# Patient Record
Sex: Female | Born: 1995 | Race: Black or African American | Hispanic: No | Marital: Single | Smoking: Never smoker
Health system: Southern US, Community
[De-identification: ages and names within clinical notes are randomized; demographics above are authoritative.]

## PROBLEM LIST (undated history)

## (undated) DIAGNOSIS — N6019 Diffuse cystic mastopathy of unspecified breast: Secondary | ICD-10-CM

## (undated) DIAGNOSIS — M6740 Ganglion, unspecified site: Secondary | ICD-10-CM

## (undated) DIAGNOSIS — R7303 Prediabetes: Secondary | ICD-10-CM

## (undated) DIAGNOSIS — M67471 Ganglion, right ankle and foot: Secondary | ICD-10-CM

## (undated) HISTORY — PX: WISDOM TOOTH EXTRACTION: SHX21

---
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
## 2017-03-22 ENCOUNTER — Ambulatory Visit (HOSPITAL_COMMUNITY)
Admission: RE | Admit: 2017-03-22 | Discharge: 2017-03-22 | Disposition: A | Discharge status: Home / Self Care | Payer: Self-pay | Source: Ambulatory Visit | Attending: Internal Medicine | Admitting: Internal Medicine

## 2017-03-22 ENCOUNTER — Ambulatory Visit: Payer: Medicaid Other | Attending: Internal Medicine | Admitting: Internal Medicine

## 2017-03-22 ENCOUNTER — Encounter: Payer: Self-pay | Admitting: Internal Medicine

## 2017-03-22 VITALS — BP 103/69 | HR 86 | Temp 98.3°F | Resp 16 | Wt 170.6 lb

## 2017-03-22 DIAGNOSIS — M79672 Pain in left foot: Secondary | ICD-10-CM | POA: Insufficient documentation

## 2017-03-22 DIAGNOSIS — Z9889 Other specified postprocedural states: Secondary | ICD-10-CM | POA: Insufficient documentation

## 2017-03-22 DIAGNOSIS — Z8249 Family history of ischemic heart disease and other diseases of the circulatory system: Secondary | ICD-10-CM | POA: Insufficient documentation

## 2017-03-22 DIAGNOSIS — B354 Tinea corporis: Secondary | ICD-10-CM

## 2017-03-22 DIAGNOSIS — Z79899 Other long term (current) drug therapy: Secondary | ICD-10-CM | POA: Insufficient documentation

## 2017-03-22 MED ORDER — CLOTRIMAZOLE 1 % EX CREA: 1.0000 "application " | TOPICAL_CREAM | Freq: Two times a day (BID) | CUTANEOUS | 0 refills | Status: DC

## 2017-03-22 NOTE — Progress Notes (Signed)
Patient ID: Nicole Griffin, female    DOB: [birth year]  MRN: [id number]  CC: New Patient (Initial Visit); Foot Pain; and Tinea (possible)   Subjective: Nicole Griffin is a 21 y.o. female who presents for lt foot pain Her concerns today include:  1.  Had ganglion cyst removed surgically from  LT foot 2 yrs ago -noticed swelling in same location last yr and it has gotten larger. -pain when she presses down on the area  2.  Thinks she has ringworm on LT buttock -notice rash x 3-4 wks. -does not itch but scaly.   There are no active problems to display for this patient.    Current Outpatient Prescriptions on File Prior to Visit  Medication Sig Dispense Refill  . HYDROcodone-acetaminophen (NORCO) 5-325 MG per tablet Take 2 tablets by mouth every 4 (four) hours as needed. (Patient not taking: Reported on 03/22/2017) 60 tablet 0  . ondansetron (ZOFRAN) 4 MG tablet Take 1 tablet (4 mg total) by mouth every 8 (eight) hours as needed for nausea or vomiting. (Patient not taking: Reported on 03/22/2017) 60 tablet 0   No current facility-administered medications on file prior to visit.     No Known Allergies  Social History   Social History  . Marital status: Single    Spouse name: N/A  . Number of children: N/A  . Years of education: N/A   Occupational History  . Not on file.   Social History Main Topics  . Smoking status: Never Smoker  . Smokeless tobacco: Never Used  . Alcohol use No  . Drug use: No  . Sexual activity: Not on file   Other Topics Concern  . Not on file   Social History Narrative  . No narrative on file    Family History  Problem Relation Age of Onset  . Hypertension Maternal Grandmother     Past Surgical History:  Procedure Laterality Date  . GANGLION CYST EXCISION Left 10/19/2014   Procedure: LEFT FOOT GANGLION EXCISION;  Surgeon: Renette Butters, MD;  Location: [facility];  Service: Orthopedics;  Laterality: Left;  . WISDOM  TOOTH EXTRACTION      ROS: Review of Systems As stated above PHYSICAL EXAM: BP 103/69   Pulse 86   Temp 98.3 F (36.8 C) (Oral)   Resp 16   Wt 170 lb 9.6 oz (77.4 kg)   SpO2 100%  LNMP ended 2nd week of this month. Physical Exam  General appearance - alert, well appearing, young African-American female and in no distress Mental status - alert, oriented to person, place, and time, normal mood, behavior, speech, dress, motor activity, and thought processes Musculoskeletal -LT foot: Healed scar noted on dorsum of midfoot. Slight tenderness around the scar but no distinct soft tissue mass appreciated. She does have edema of the soft tissue dorsum forefoot but not the toes. Mild tenderness over this edematous area. Good range of motion at the ankles and toes Skin -1.5 cm rash lateral left buttock. Upper long in shape with distinct scaly border and central clearing  ASSESSMENT AND PLAN: 1. Foot pain, left -Informed patient that I do not feel a distinct cysts at this time. I recommend observation. In regards to the soft tissue swelling I recommend elevation of the foot at nights and wearing compression socks during the day. - DG Foot Complete Left; Future  2. Ringworm of body - clotrimazole (LOTRIMIN AF) 1 % cream; Apply 1 application topically 2 (two) times daily.  Dispense: 30 g; Refill: 0  Patient was given the opportunity to ask questions.  Patient verbalized understanding of the plan and was able to repeat key elements of the plan.   Orders Placed This Encounter  Procedures  . DG Foot Complete Left     Requested Prescriptions   Signed Prescriptions Disp Refills  . clotrimazole (LOTRIMIN AF) 1 % cream 30 g 0    Sig: Apply 1 application topically 2 (two) times daily.   Karle Plumber, MD, FACP

## 2017-03-22 NOTE — Patient Instructions (Signed)
Use the Lotrimin cream on rash as discussed.   Try to elevated the left foot at nights and wear a compression socks during the day.

## 2017-03-23 ENCOUNTER — Telehealth: Payer: Self-pay

## 2017-03-23 NOTE — Telephone Encounter (Signed)
Contacted pt to go over xray results pt didn't answer lvm informing pt of results.    If pt calls back please give results: x-ray of her foot was normal.

## 2017-04-05 ENCOUNTER — Telehealth: Payer: Self-pay | Admitting: Internal Medicine

## 2017-04-05 DIAGNOSIS — R600 Localized edema: Secondary | ICD-10-CM

## 2017-04-05 NOTE — Telephone Encounter (Signed)
Will forward to pcp

## 2017-04-05 NOTE — Telephone Encounter (Signed)
Pt and her mother called requesting results of foot x-ray. Per notes in Epic left by [facility], results were given. Pt's mother requests a call from PCP [facility] or CMA Sallyanne Havers stating that the pt is still experiencing swelling and puffiness in her foot and would like to speak to someone about what may be causing it. Please f/u. Thank you.

## 2017-04-08 NOTE — Telephone Encounter (Signed)
Contacted mother to make aware bout the podiatry referral and it will take 1-2 weeks to hear for an appointment. Pt mother states she understands and doesn't have any questions or concerns

## 2017-04-15 ENCOUNTER — Encounter: Payer: Self-pay | Admitting: Internal Medicine

## 2017-04-15 ENCOUNTER — Ambulatory Visit: Payer: Self-pay | Attending: Internal Medicine | Admitting: Internal Medicine

## 2017-04-15 VITALS — BP 91/64 | HR 81 | Temp 98.4°F | Resp 16 | Wt 168.6 lb

## 2017-04-15 DIAGNOSIS — F419 Anxiety disorder, unspecified: Secondary | ICD-10-CM

## 2017-04-15 DIAGNOSIS — F329 Major depressive disorder, single episode, unspecified: Secondary | ICD-10-CM

## 2017-04-15 DIAGNOSIS — G43019 Migraine without aura, intractable, without status migrainosus: Secondary | ICD-10-CM

## 2017-04-15 MED ORDER — IBUPROFEN 600 MG PO TABS: 600.0000 mg | ORAL_TABLET | Freq: Three times a day (TID) | ORAL | 1 refills | Status: DC | PRN

## 2017-04-15 MED ORDER — AMITRIPTYLINE HCL 10 MG PO TABS: 10.0000 mg | ORAL_TABLET | Freq: Every day | ORAL | 1 refills | Status: DC

## 2017-04-15 MED FILL — AMITRIPTYLINE HCL 10 MG TAB: 10 | 30 days supply | Qty: 30 | Fill #0

## 2017-04-15 MED FILL — IBUPROFEN 600 MG TABLET: 600 | 20 days supply | Qty: 60 | Fill #0

## 2017-04-15 NOTE — Patient Instructions (Signed)
Please give appt with Ms. Luciana Axe.   Migraine Headache A migraine headache is a very strong throbbing pain on one side or both sides of your head. Migraines can also cause other symptoms. Talk with your doctor about what things may bring on (trigger) your migraine headaches. Follow these instructions at home: Medicines  Take over-the-counter and prescription medicines only as told by your doctor.  Do not drive or use heavy machinery while taking prescription pain medicine.  To prevent or treat constipation while you are taking prescription pain medicine, your doctor may recommend that you: ? Drink enough fluid to keep your pee (urine) clear or pale yellow. ? Take over-the-counter or prescription medicines. ? Eat foods that are high in fiber. These include fresh fruits and vegetables, whole grains, and beans. ? Limit foods that are high in fat and processed sugars. These include fried and sweet foods. Lifestyle  Avoid alcohol.  Do not use any products that contain nicotine or tobacco, such as cigarettes and e-cigarettes. If you need help quitting, ask your doctor.  Get at least 8 hours of sleep every night.  Limit your stress. General instructions   Keep a journal to find out what may bring on your migraines. For example, write down: ? What you eat and drink. ? How much sleep you get. ? Any change in what you eat or drink. ? Any change in your medicines.  If you have a migraine: ? Avoid things that make your symptoms worse, such as bright lights. ? It may help to lie down in a dark, quiet room. ? Do not drive or use heavy machinery. ? Ask your doctor what activities are safe for you.  Keep all follow-up visits as told by your doctor. This is important. Contact a doctor if:  You get a migraine that is different or worse than your usual migraines. Get help right away if:  Your migraine gets very bad.  You have a fever.  You have a stiff neck.  You have trouble  seeing.  Your muscles feel weak or like you cannot control them.  You start to lose your balance a lot.  You start to have trouble walking.  You pass out (faint). This information is not intended to replace advice given to you by your health care provider. Make sure you discuss any questions you have with your health care provider. Document Released: 06/23/2008 Document Revised: 04/03/2016 Document Reviewed: 03/02/2016 Elsevier Interactive Patient Education  2017 [employer].

## 2017-04-15 NOTE — Progress Notes (Signed)
Patient ID: Nicole Griffin, female    DOB: October 02, 1995  MRN: [id number]  CC: Headache   Subjective: Nicole Griffin is a 21 y.o. female who presents for UC visit. Her concerns today include:   Pt c/o headaches for past 2 wks -RT side of head and face. -daily for past 1.5 wks -comes on suddenly and last 6-7 hrs "if I don't take anything." -"I can feel it coming on.  Can start at back of my head and goes to the top." -no N/V, blurred vision, dizziness. Photophobia and phenophobia sometimes.  -better laying down than moving around -no prior hx of headaches.  -taking Aleve, not every day.  Takes 1 tab with relief in 1 hr. -triggered by stress - stressors include school and every day life acknowledges feeling depressed.  "overthinking things.  Some days I don't want to get out of bed."  Depressed for 1.5 yrs.  No SI/HI.  Also feels anxiety over life since high school.  Getting worse.  "If I have really bad anxiety, I don't talk and just stay in my room."  Worries about having another attack when she has one. -not on meds in past for depression/anxiety    Current Outpatient Prescriptions on File Prior to Visit  Medication Sig Dispense Refill  . clotrimazole (LOTRIMIN AF) 1 % cream Apply 1 application topically 2 (two) times daily. (Patient not taking: Reported on 04/15/2017) 30 g 0  . ondansetron (ZOFRAN) 4 MG tablet Take 1 tablet (4 mg total) by mouth every 8 (eight) hours as needed for nausea or vomiting. (Patient not taking: Reported on 03/22/2017) 60 tablet 0   No current facility-administered medications on file prior to visit.     No Known Allergies  Social History   Social History  . Marital status: Single    Spouse name: N/A  . Number of children: N/A  . Years of education: N/A   Occupational History  . Not on file.   Social History Main Topics  . Smoking status: Never Smoker  . Smokeless tobacco: Never Used  . Alcohol use No  . Drug use: No  . Sexual activity:  Not on file   Other Topics Concern  . Not on file   Social History Narrative  . No narrative on file    Family History  Problem Relation Age of Onset  . Hypertension Maternal Grandmother     Past Surgical History:  Procedure Laterality Date  . GANGLION CYST EXCISION Left 10/19/2014   Procedure: LEFT FOOT GANGLION EXCISION;  Surgeon: Renette Butters, MD;  Location: [facility];  Service: Orthopedics;  Laterality: Left;  . WISDOM TOOTH EXTRACTION      ROS: Review of Systems As stated above PHYSICAL EXAM: BP 91/64   Pulse 81   Temp 98.4 F (36.9 C) (Oral)   Resp 16   Wt 168 lb 9.6 oz (76.5 kg)   SpO2 100%   LNMP: 04/09/2017.  BP 110/80 Physical Exam  General appearance - alert, well appearing, and in no distress Mental status - alert, oriented to person, place, and time, normal mood, behavior, speech, dress, motor activity, and thought processes Neck:Supple Ears: Small amount of soft wax in both ears otherwise canal and TM within normal limits Nose: no enlargement of nasal turbinates Neurological - cranial nerves II through XII intact, normal muscle tone, no tremors, strength 5/5. Gross sensation intact  Depression screen PHQ 2/9 04/15/2017  Decreased Interest 1  Down, Depressed, Hopeless 2  PHQ - 2 Score 3  Altered sleeping 2  Tired, decreased energy 1  Change in appetite 0  Feeling bad or failure about yourself  2  Trouble concentrating 0  Moving slowly or fidgety/restless 0  Suicidal thoughts 1  PHQ-9 Score 9   GAD 7 : Generalized Anxiety Score 04/15/2017 03/22/2017  Nervous, Anxious, on Edge 3 3  Control/stop worrying 3 2  Worry too much - different things 3 2  Trouble relaxing 0 0  Restless 0 0  Easily annoyed or irritable 3 3  Afraid - awful might happen 0 3  Total GAD 7 Score 12 13     ASSESSMENT AND PLAN: 1. Intractable migraine without aura and without status migrainosus -Headaches suggest migraines. We discussed ways to manage  stress since this is one of her triggers. She declines meeting with LCSW today. -Discussed migraine management with abortive and prophylactic therapies. Since she responds well to Aleve will use 600 mg of ibuprofen for abortive therapy. For prophylactic we decided to go with amitriptyline as it will also help with depression. Patient informed that med can cause drowsiness - ibuprofen (ADVIL,MOTRIN) 600 MG tablet; Take 1 tablet (600 mg total) by mouth every 8 (eight) hours as needed.  Dispense: 60 tablet; Refill: 1 - amitriptyline (ELAVIL) 10 MG tablet; Take 1 tablet (10 mg total) by mouth at bedtime.  Dispense: 30 tablet; Refill: 1  2. Anxiety and depression -We discussed treatment options like an SSRI but patient declines. She was okay with amitriptyline at bedtime to help decrease frequency of headaches when told that it will also help with depression. She declines meeting with our LCSW today. I discuss information about community resources including [place]. - amitriptyline (ELAVIL) 10 MG tablet; Take 1 tablet (10 mg total) by mouth at bedtime.  Dispense: 30 tablet; Refill: 1  She has form for [facility] discount and had some questions about it.  I deferred to Ms. Luciana Axe.  Patient was given the opportunity to ask questions.  Patient verbalized understanding of the plan and was able to repeat key elements of the plan.   No orders of the defined types were placed in this encounter.    Requested Prescriptions   Signed Prescriptions Disp Refills  . ibuprofen (ADVIL,MOTRIN) 600 MG tablet 60 tablet 1    Sig: Take 1 tablet (600 mg total) by mouth every 8 (eight) hours as needed.  Marland Kitchen amitriptyline (ELAVIL) 10 MG tablet 30 tablet 1    Sig: Take 1 tablet (10 mg total) by mouth at bedtime.    Return if symptoms worsen or fail to improve.  Karle Plumber, MD, FACP

## 2017-04-19 ENCOUNTER — Ambulatory Visit: Payer: Self-pay | Attending: Internal Medicine

## 2018-04-04 ENCOUNTER — Other Ambulatory Visit (HOSPITAL_COMMUNITY): Payer: Self-pay | Admitting: *Deleted

## 2018-04-04 DIAGNOSIS — N6310 Unspecified lump in the right breast, unspecified quadrant: Secondary | ICD-10-CM

## 2018-04-05 ENCOUNTER — Ambulatory Visit: Payer: Self-pay | Attending: Internal Medicine | Admitting: Internal Medicine

## 2018-04-05 ENCOUNTER — Encounter: Payer: Self-pay | Admitting: Internal Medicine

## 2018-04-05 VITALS — BP 119/76 | HR 68 | Temp 98.9°F | Resp 16 | Wt 167.0 lb

## 2018-04-05 DIAGNOSIS — Z79899 Other long term (current) drug therapy: Secondary | ICD-10-CM | POA: Insufficient documentation

## 2018-04-05 DIAGNOSIS — N6310 Unspecified lump in the right breast, unspecified quadrant: Secondary | ICD-10-CM | POA: Insufficient documentation

## 2018-04-05 NOTE — Patient Instructions (Addendum)
Please let me know if you have not had the ultra sound done in 1 month.   Please call the [facility] (breast and cervical cancer control program) at [phone] to schedule diagnostic mammogram   [facility]

## 2018-04-05 NOTE — Progress Notes (Signed)
Pt states she has a lump in her right breast

## 2018-04-05 NOTE — Progress Notes (Signed)
Patient ID: ELLEEN Griffin, female    DOB: 1995-11-21  MRN: [id number]  CC: Mass (possible )   Subjective: Nicole Griffin is a 22 y.o. female who presents for.  Mom is with her. Her concerns today include:   Patient complains of lump in RT x 2 wks Not painful. previous lump in the same breast evaluated in 2010 and was found to be a 7 mm cyst in the 9 o'clock position.  Patient states that the current lump is in a position similar to the one from 9 years ago.  No family history of breast cancer.    Current Outpatient Medications on File Prior to Visit  Medication Sig Dispense Refill  . amitriptyline (ELAVIL) 10 MG tablet Take 1 tablet (10 mg total) by mouth at bedtime. 30 tablet 1  . clotrimazole (LOTRIMIN AF) 1 % cream Apply 1 application topically 2 (two) times daily. (Patient not taking: Reported on 04/15/2017) 30 g 0  . ibuprofen (ADVIL,MOTRIN) 600 MG tablet Take 1 tablet (600 mg total) by mouth every 8 (eight) hours as needed. 60 tablet 1   No current facility-administered medications on file prior to visit.     No Known Allergies  Social History   Socioeconomic History  . Marital status: Single    Spouse name: Not on file  . Number of children: Not on file  . Years of education: Not on file  . Highest education level: Not on file  Occupational History  . Not on file  Social Needs  . Financial resource strain: Not on file  . Food insecurity:    Worry: Not on file    Inability: Not on file  . Transportation needs:    Medical: Not on file    Non-medical: Not on file  Tobacco Use  . Smoking status: Never Smoker  . Smokeless tobacco: Never Used  Substance and Sexual Activity  . Alcohol use: No  . Drug use: No  . Sexual activity: Not on file  Lifestyle  . Physical activity:    Days per week: Not on file    Minutes per session: Not on file  . Stress: Not on file  Relationships  . Social connections:    Talks on phone: Not on file    Gets together: Not on  file    Attends religious service: Not on file    Active member of club or organization: Not on file    Attends meetings of clubs or organizations: Not on file    Relationship status: Not on file  . Intimate partner violence:    Fear of current or ex partner: Not on file    Emotionally abused: Not on file    Physically abused: Not on file    Forced sexual activity: Not on file  Other Topics Concern  . Not on file  Social History Narrative  . Not on file    Family History  Problem Relation Age of Onset  . Hypertension Maternal Grandmother     Past Surgical History:  Procedure Laterality Date  . GANGLION CYST EXCISION Left 10/19/2014   Procedure: LEFT FOOT GANGLION EXCISION;  Surgeon: Renette Butters, MD;  Location: [facility];  Service: Orthopedics;  Laterality: Left;  . WISDOM TOOTH EXTRACTION      ROS: Review of Systems Negative except as stated above PHYSICAL EXAM: BP 119/76   Pulse 68   Temp 98.9 F (37.2 C) (Oral)   Resp 16   Wt  167 lb (75.8 kg)   SpO2 100%   Physical Exam  General appearance - alert, well appearing, and in no distress Mental status - normal mood, behavior, speech, dress, motor activity, and thought processes Breasts -no axillary lymphadenopathy.  Examination of both breasts reveal no asymmetry or dimpling of the skin.  No palpable mass in the left breast.  3 cm firm movable mass in the right breast around about the 9 o'clock position.   ASSESSMENT AND PLAN: 1. Mass of breast, right She will need to have an ultrasound and further management will be based on results.  Patient has family-planning Medicaid.  I am not sure whether this would cover.  In any event I had my CMA tell her how to go about calling to schedule the appointment with [facility]P just encase. - US BREAST COMPLETE UNI RIGHT INC AXILLA; Future  Patient was given the opportunity to ask questions.  Patient verbalized understanding of the plan and was able to repeat key  elements of the plan.   Orders Placed This Encounter  Procedures  . US BREAST COMPLETE UNI RIGHT INC AXILLA     Requested Prescriptions    No prescriptions requested or ordered in this encounter    No follow-ups on file.  Karle Plumber, MD, FACP

## 2018-04-26 ENCOUNTER — Ambulatory Visit (HOSPITAL_COMMUNITY)
Admission: RE | Admit: 2018-04-26 | Discharge: 2018-04-26 | Disposition: A | Discharge status: Home / Self Care | Payer: Self-pay | Source: Ambulatory Visit | Attending: Obstetrics and Gynecology | Admitting: Obstetrics and Gynecology

## 2018-04-26 ENCOUNTER — Ambulatory Visit
Admission: RE | Admit: 2018-04-26 | Discharge: 2018-04-26 | Disposition: A | Discharge status: Home / Self Care | Payer: No Typology Code available for payment source | Source: Ambulatory Visit | Attending: Obstetrics and Gynecology | Admitting: Obstetrics and Gynecology

## 2018-04-26 ENCOUNTER — Encounter (HOSPITAL_COMMUNITY): Payer: Self-pay

## 2018-04-26 ENCOUNTER — Other Ambulatory Visit (HOSPITAL_COMMUNITY): Payer: Self-pay | Admitting: Obstetrics and Gynecology

## 2018-04-26 VITALS — BP 105/67 | Ht 62.0 in

## 2018-04-26 DIAGNOSIS — Z1239 Encounter for other screening for malignant neoplasm of breast: Secondary | ICD-10-CM

## 2018-04-26 DIAGNOSIS — N6310 Unspecified lump in the right breast, unspecified quadrant: Secondary | ICD-10-CM

## 2018-04-26 DIAGNOSIS — D241 Benign neoplasm of right breast: Secondary | ICD-10-CM

## 2018-04-26 DIAGNOSIS — N6315 Unspecified lump in the right breast, overlapping quadrants: Secondary | ICD-10-CM

## 2018-04-26 NOTE — Patient Instructions (Addendum)
Explained breast self awareness with Youlanda Mighty. Let patient know BCCCP will cover Pap smears every 3 years unless has a history of abnormal Pap smears. Referred patient to the [facility] for a right breast ultrasound. Appointment scheduled for Tuesday, April 26, 2018 at 1300. Youlanda Mighty verbalized understanding.  Hadyn Azer, Arvil Chaco, RN 11:16 AM

## 2018-04-26 NOTE — Progress Notes (Signed)
Complaints of right breast lump x 7 years that has increased in size over the past month.   Pap Smear: Pap smear not completed today. Per patient has never had a Pap smear completed. Patient stated she has an appointment with Charlesetta Ivory to have her Pap smear completed in October. Patient will have a copy of Pap smear result forwarded to [facility]. No Pap smear results are in Epic.  Physical exam: Breasts Breasts symmetrical. No skin abnormalities bilateral breasts. No nipple retraction bilateral breasts. No nipple discharge bilateral breasts. No lymphadenopathy. No lumps palpated left breast. Palpated a mobile lump within the right breast at 9 o'clock 2 cm from the nipple. No complaints of pain or tenderness on exam. Referred patient to the [facility] for a right breast ultrasound. Appointment scheduled for Tuesday, April 26, 2018 at 1300.        Pelvic/Bimanual No Pap smear completed today since patient has appointment in October to have her Pap smear completed with her OBGYN.   Smoking History: Patient has never smoked.  Patient Navigation: Patient education provided. Access to services provided for patient through B[facility]CP program.  Breast and Cervical Cancer Risk Assessment: Patient has no family history of breast cancer, known genetic mutations, or radiation treatment to the chest before age 54. Patient has no history of cervical dysplasia, immunocompromised, or DES exposure in-utero. Breast Cancer Risk assessement completed. No risk calculated due to patient is less than 42 years old.

## 2018-04-29 ENCOUNTER — Encounter (HOSPITAL_COMMUNITY): Payer: Self-pay | Admitting: *Deleted

## 2018-10-28 ENCOUNTER — Other Ambulatory Visit (HOSPITAL_COMMUNITY): Payer: Self-pay | Admitting: Obstetrics and Gynecology

## 2018-10-28 ENCOUNTER — Ambulatory Visit
Admission: RE | Admit: 2018-10-28 | Discharge: 2018-10-28 | Disposition: A | Discharge status: Home / Self Care | Payer: No Typology Code available for payment source | Source: Ambulatory Visit | Attending: Obstetrics and Gynecology | Admitting: Obstetrics and Gynecology

## 2018-10-28 DIAGNOSIS — D241 Benign neoplasm of right breast: Secondary | ICD-10-CM

## 2018-11-14 ENCOUNTER — Encounter: Payer: Self-pay | Admitting: Internal Medicine

## 2018-11-14 NOTE — Progress Notes (Signed)
I received a note from Dr. Marlou Starks of [facility] surgery.  He saw the patient 11/08/2018.  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  Patient wishes to have it removed.  He plans to do a lumpectomy.

## 2018-11-15 ENCOUNTER — Ambulatory Visit: Payer: Self-pay | Admitting: General Surgery

## 2018-12-13 ENCOUNTER — Encounter: Payer: Self-pay | Admitting: *Deleted

## 2018-12-23 ENCOUNTER — Ambulatory Visit (HOSPITAL_BASED_OUTPATIENT_CLINIC_OR_DEPARTMENT_OTHER): Admit: 2018-12-23 | Payer: No Typology Code available for payment source | Admitting: General Surgery

## 2018-12-23 ENCOUNTER — Encounter (HOSPITAL_BASED_OUTPATIENT_CLINIC_OR_DEPARTMENT_OTHER): Payer: Self-pay

## 2018-12-23 SURGERY — BREAST LUMPECTOMY
Anesthesia: General | Site: Breast | Laterality: Right

## 2019-03-17 ENCOUNTER — Encounter (HOSPITAL_BASED_OUTPATIENT_CLINIC_OR_DEPARTMENT_OTHER): Payer: Self-pay | Admitting: *Deleted

## 2019-03-17 ENCOUNTER — Other Ambulatory Visit: Payer: Self-pay

## 2019-03-17 NOTE — Progress Notes (Signed)
Pre op call done for right breast lumpectomy scheduled for 03/27/19 with Dr Marlou Starks. Towards the end of the call the pt mentioned that she would like to postpone the surgery. I gave her the main number to [facility] and asked her to call and speak with the surgery scheduler and find a better date. She verbalized understanding.

## 2019-03-23 ENCOUNTER — Other Ambulatory Visit (HOSPITAL_COMMUNITY): Payer: No Typology Code available for payment source

## 2019-03-23 NOTE — Progress Notes (Signed)
I spoke to Nicole Griffin mother to inquire if she would be arriving soon for her Covid 76 screen. Her mother stated her daughter is planning to reschedule her procedure on 6/29 for a later date. I canceled her screening appointment for today. She assured me she would call Dr Marlou Starks office today to reschedule procedure.

## 2019-03-24 NOTE — Progress Notes (Signed)
Called Dr. Ethlyn Gallery office today to inquire if Nicole Griffin had decided to cancel or proceed with procedure 6/29. Would need to reschedule Covid 19 screen for today. The office will call patient and follow up with patient decision.

## 2019-03-27 ENCOUNTER — Ambulatory Visit (HOSPITAL_BASED_OUTPATIENT_CLINIC_OR_DEPARTMENT_OTHER)
Admission: RE | Admit: 2019-03-27 | Payer: No Typology Code available for payment source | Source: Home / Self Care | Admitting: General Surgery

## 2019-03-27 SURGERY — BREAST LUMPECTOMY
Anesthesia: General | Site: Breast | Laterality: Right

## 2019-04-29 ENCOUNTER — Encounter (HOSPITAL_COMMUNITY): Payer: Self-pay | Admitting: *Deleted

## 2019-04-29 ENCOUNTER — Other Ambulatory Visit: Payer: Self-pay

## 2019-04-29 ENCOUNTER — Ambulatory Visit (HOSPITAL_COMMUNITY)
Admission: EM | Admit: 2019-04-29 | Discharge: 2019-04-29 | Disposition: A | Discharge status: Home / Self Care | Payer: BC Managed Care – PPO | Attending: Emergency Medicine | Admitting: Emergency Medicine

## 2019-04-29 ENCOUNTER — Ambulatory Visit (INDEPENDENT_AMBULATORY_CARE_PROVIDER_SITE_OTHER): Payer: BC Managed Care – PPO

## 2019-04-29 DIAGNOSIS — S9001XA Contusion of right ankle, initial encounter: Secondary | ICD-10-CM | POA: Diagnosis not present

## 2019-04-29 DIAGNOSIS — W109XXA Fall (on) (from) unspecified stairs and steps, initial encounter: Secondary | ICD-10-CM | POA: Diagnosis not present

## 2019-04-29 DIAGNOSIS — Y92009 Unspecified place in unspecified non-institutional (private) residence as the place of occurrence of the external cause: Secondary | ICD-10-CM | POA: Diagnosis not present

## 2019-04-29 HISTORY — DX: Diffuse cystic mastopathy of unspecified breast: N60.19

## 2019-04-29 HISTORY — DX: Ganglion, unspecified site: M67.40

## 2019-04-29 MED ORDER — IBUPROFEN 600 MG PO TABS: 600.0000 mg | ORAL_TABLET | Freq: Four times a day (QID) | ORAL | 0 refills | Status: DC | PRN

## 2019-04-29 NOTE — ED Provider Notes (Signed)
[place]    CSN: [id number] Arrival date & time: 04/29/19  1506     History   Chief Complaint Chief Complaint  Patient presents with  . Ankle Pain    HPI Nicole Griffin is a 23 y.o. female.   Patient presents with right ankle and foot pain after tripped and fell down her steps approximately 40 minutes PTA.  She denies head injury or LOC.  States she twisted her ankle and now has pain and swelling.  She denies LE numbness, paresthesias, weakness.  LMP: 1 week.    The history is provided by the patient.    Past Medical History:  Diagnosis Date  . Fibrocystic breast   . Ganglion cyst     There are no active problems to display for this patient.   Past Surgical History:  Procedure Laterality Date  . GANGLION CYST EXCISION Left 10/19/2014   Procedure: LEFT FOOT GANGLION EXCISION;  Surgeon: Renette Butters, MD;  Location: [place];  Service: Orthopedics;  Laterality: Left;  . WISDOM TOOTH EXTRACTION      OB History    Gravida  0   Para  0   Term  0   Preterm  0   AB  0   Living  0     SAB  0   TAB  0   Ectopic  0   Multiple  0   Live Births  0            Home Medications    Prior to Admission medications   Medication Sig Start Date End Date Taking? Authorizing Provider  drospirenone-ethinyl estradiol (NIKKI) 3-0.02 MG tablet Take 1 tablet by mouth daily.   Yes Joline Salt, RN  ibuprofen (ADVIL) 600 MG tablet Take 1 tablet (600 mg total) by mouth every 6 (six) hours as needed. 04/29/19   Sharion Balloon, NP  Multiple Vitamins-Minerals (EQ MULTIVITAMINS ADULT GUMMY) CHEW Chew by mouth.    Joline Salt, RN    Family History Family History  Problem Relation Age of Onset  . Hypertension Maternal Grandmother   . Healthy Mother     Social History Social History   Tobacco Use  . Smoking status: Never Smoker  . Smokeless tobacco: Never Used  Substance Use Topics  . Alcohol use: Yes    Comment: rare  .  Drug use: No     Allergies   Patient has no known allergies.   Review of Systems Review of Systems  Constitutional: Negative for chills and fever.  HENT: Negative for ear pain and sore throat.   Eyes: Negative for pain and visual disturbance.  Respiratory: Negative for cough and shortness of breath.   Cardiovascular: Negative for chest pain and palpitations.  Gastrointestinal: Negative for abdominal pain and vomiting.  Genitourinary: Negative for dysuria and hematuria.  Musculoskeletal: Positive for arthralgias. Negative for back pain.  Skin: Negative for color change and rash.  Neurological: Negative for seizures and syncope.  All other systems reviewed and are negative.    Physical Exam Triage Vital Signs ED Triage Vitals  Enc Vitals Group     BP      Pulse      Resp      Temp      Temp src      SpO2      Weight      Height      Head Circumference      Peak  Flow      Pain Score      Pain Loc      Pain Edu?      Excl. in [place]?    No data found.  Updated Vital Signs BP 110/67   Pulse 82   Temp 98.6 F (37 C) (Oral)   Resp 18   Wt 153 lb (69.4 kg)   LMP 04/23/2019 (Exact Date)   SpO2 100%   BMI 27.98 kg/m   Visual Acuity Right Eye Distance:   Left Eye Distance:   Bilateral Distance:    Right Eye Near:   Left Eye Near:    Bilateral Near:     Physical Exam Vitals signs and nursing note reviewed.  Constitutional:      General: She is not in acute distress.    Appearance: She is well-developed.  HENT:     Head: Normocephalic and atraumatic.  Eyes:     Conjunctiva/sclera: Conjunctivae normal.  Neck:     Musculoskeletal: Neck supple.  Cardiovascular:     Rate and Rhythm: Normal rate and regular rhythm.     Heart sounds: No murmur.  Pulmonary:     Effort: Pulmonary effort is normal. No respiratory distress.     Breath sounds: Normal breath sounds.  Abdominal:     Palpations: Abdomen is soft.     Tenderness: There is no abdominal tenderness.   Musculoskeletal:        General: Swelling and tenderness present. No deformity.       Feet:  Feet:     Comments: Right foot: Tenderness and mild edema in area indicated on diagram. No ecchymosis or erythema.  2+ pulses, sensation intact.  Range of motion limited by pain. Skin:    General: Skin is warm and dry.  Neurological:     Mental Status: She is alert.     Sensory: No sensory deficit.     Motor: No weakness.      UC Treatments / Results  Labs (all labs ordered are listed, but only abnormal results are displayed) Labs Reviewed - No data to display  EKG   Radiology Dg Ankle Complete Right  Result Date: 04/29/2019 CLINICAL DATA:  Status post fall with right ankle pain. EXAM: RIGHT ANKLE - COMPLETE 3+ VIEW COMPARISON:  None. FINDINGS: There is no evidence of fracture, dislocation, or joint effusion. There is no evidence of arthropathy or other focal bone abnormality. Soft tissues are unremarkable. IMPRESSION: Negative. Electronically Signed   By: Abelardo Diesel M.D.   On: 04/29/2019 16:13    Procedures Procedures (including critical care time)  Medications Ordered in UC Medications - No data to display  Initial Impression / Assessment and Plan / UC Course  I have reviewed the triage vital signs and the nursing notes.  Pertinent labs & imaging results that were available during my care of the patient were reviewed by me and considered in my medical decision making (see chart for details).   Contusion of right ankle.  X-ray negative.  Treating with rest, elevation, ice packs, ibuprofen, Ace wrap.  Instructed patient to return here or go to the emergency department if she develops worsening pain, numbness, paresthesias, weakness, or other concerning symptoms.  Discussed that if her pain is not improving, she can consider following up with orthopedics.     Final Clinical Impressions(s) / UC Diagnoses   Final diagnoses:  Contusion of right ankle, initial encounter      Discharge Instructions     Your  x-ray was negative today.    Rest and elevate your ankle.  Apply ice packs as directed.  Wear the Ace wrap for your comfort.  Take the prescribed ibuprofen as needed for discomfort.    Return here or go to the emergency department if you develop worsening pain, numbness, tingling, weakness, or other concerns.        ED Prescriptions    Medication Sig Dispense Auth. Provider   ibuprofen (ADVIL) 600 MG tablet Take 1 tablet (600 mg total) by mouth every 6 (six) hours as needed. 30 tablet Sharion Balloon, NP     Controlled Substance Prescriptions [place] Controlled Substance Registry consulted? Not Applicable   Sharion Balloon, NP 04/29/19 1850

## 2019-04-29 NOTE — ED Triage Notes (Signed)
Reports falling down stairs approx 40 min ago while wearing "trendy shoes".  C/O right ankle pain with painful weight bearing.  Denies any foot pain with palpation.

## 2019-04-29 NOTE — Discharge Instructions (Addendum)
Your x-ray was negative today.    Rest and elevate your ankle.  Apply ice packs as directed.  Wear the Ace wrap for your comfort.  Take the prescribed ibuprofen as needed for discomfort.    Return here or go to the emergency department if you develop worsening pain, numbness, tingling, weakness, or other concerns.

## 2019-05-01 ENCOUNTER — Other Ambulatory Visit: Payer: No Typology Code available for payment source

## 2019-09-23 ENCOUNTER — Ambulatory Visit (HOSPITAL_COMMUNITY)
Admission: EM | Admit: 2019-09-23 | Discharge: 2019-09-23 | Discharge status: Against Medical Advice | Payer: Medicaid Other

## 2019-09-23 NOTE — ED Notes (Signed)
Per D. Watts with patient intake; pt left, and did not say anything.

## 2019-10-06 ENCOUNTER — Telehealth (HOSPITAL_COMMUNITY): Payer: Self-pay

## 2019-10-06 NOTE — Telephone Encounter (Signed)
Telephoned patient at home number. Left a message to call and schedule with [facility]. 

## 2019-10-09 ENCOUNTER — Telehealth (HOSPITAL_COMMUNITY): Payer: Self-pay

## 2019-10-09 NOTE — Telephone Encounter (Signed)
Telephoned patient at home number. Left a voice message for patient to call and schedule with [facility]. °

## 2020-03-29 IMAGING — DX RIGHT ANKLE - COMPLETE 3+ VIEW
3 series · 3 of 3 positions shown · non-contrast
Comparison: None.

CLINICAL DATA: Status post fall with right ankle pain.

EXAM:
RIGHT ANKLE - COMPLETE 3+ VIEW

[ankle ap]
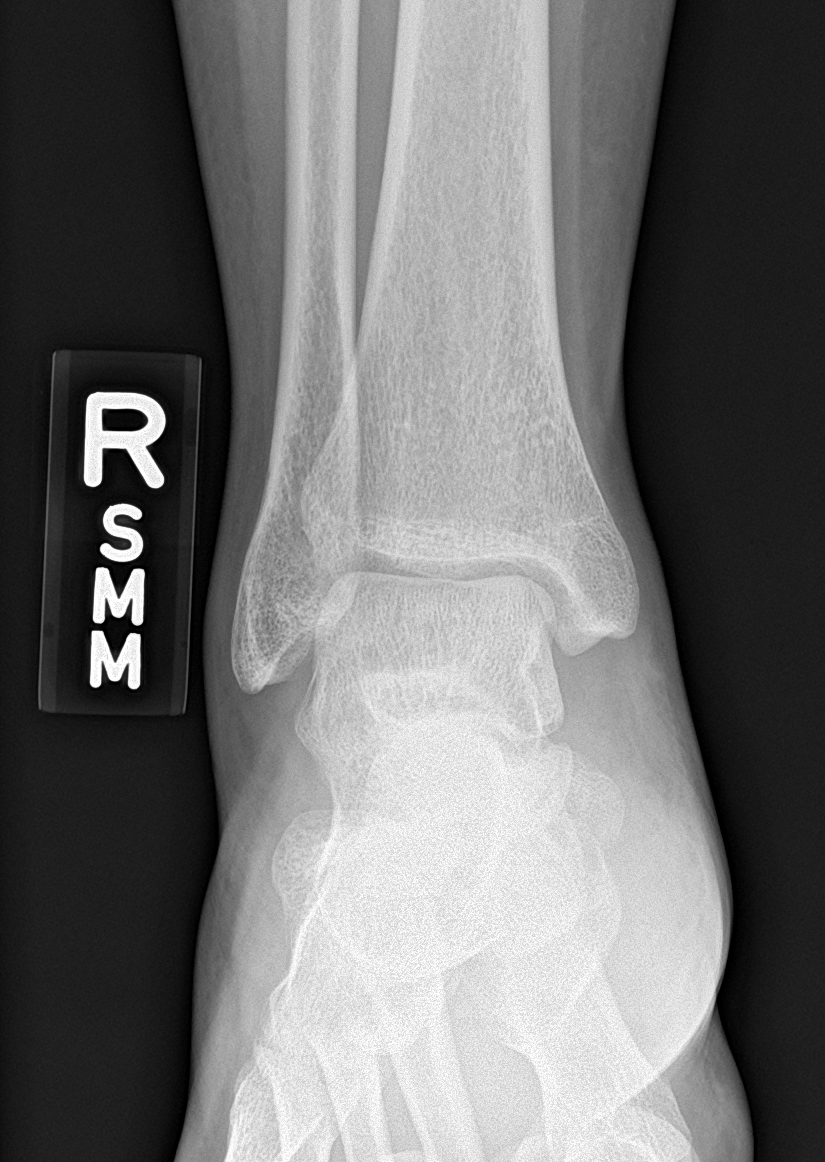

[ankle obl]
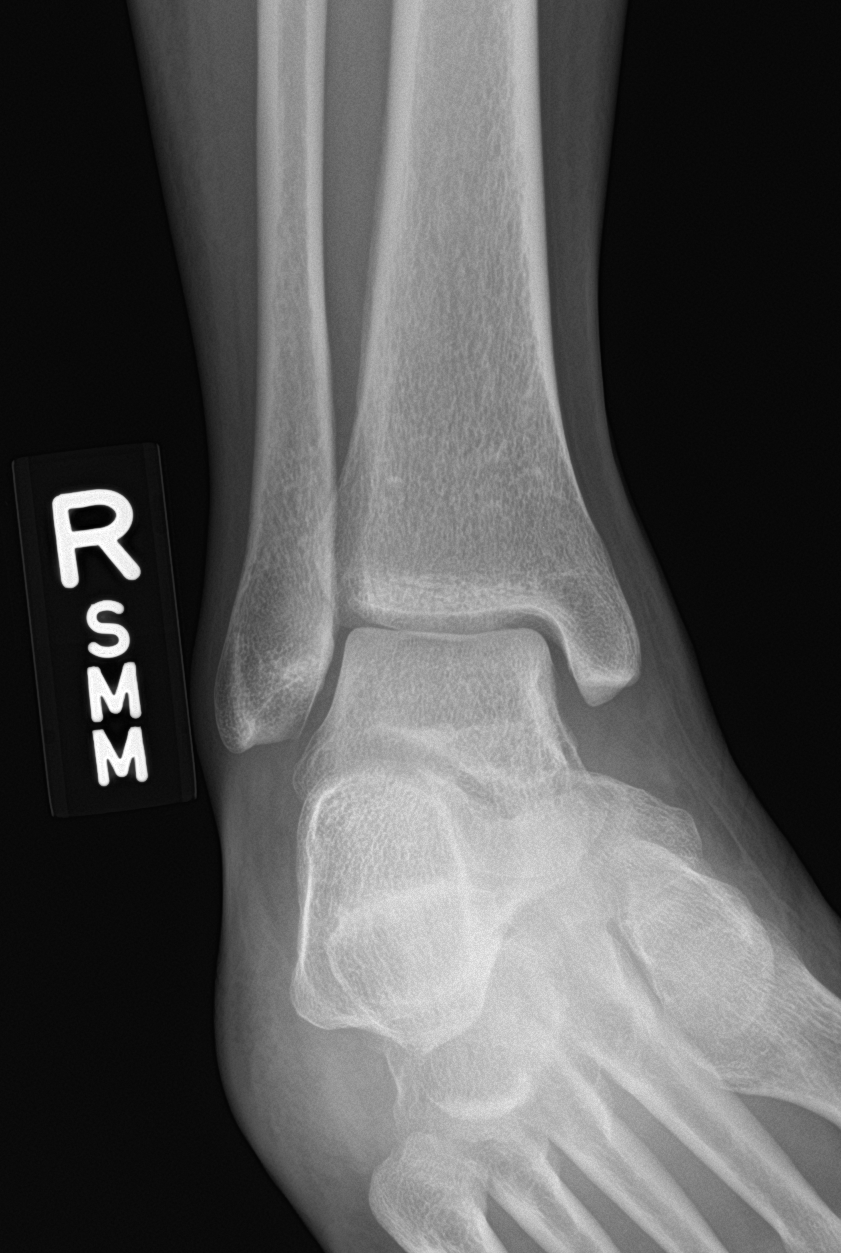

[ankle lat]
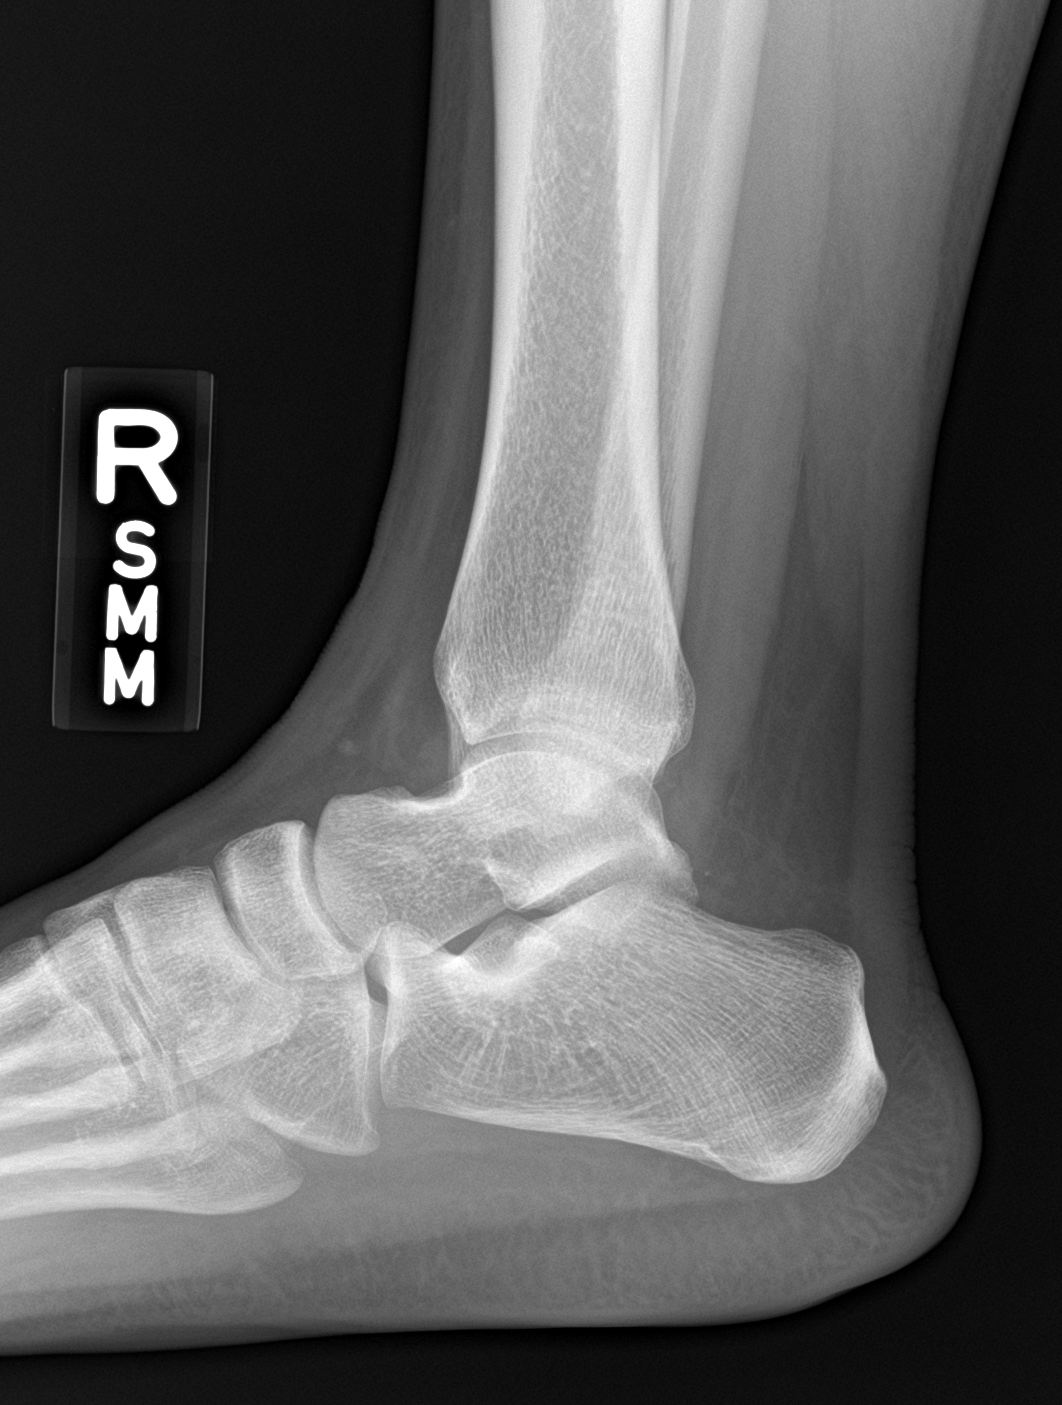

[3 of 3 positions shown; findings below may reference images not displayed]

FINDINGS: There is no evidence of fracture, dislocation, or joint effusion.
There is no evidence of arthropathy or other focal bone abnormality.
Soft tissues are unremarkable.
IMPRESSION: Negative.

## 2020-04-26 ENCOUNTER — Other Ambulatory Visit: Payer: Self-pay | Admitting: Podiatry

## 2020-04-26 ENCOUNTER — Encounter: Payer: Self-pay | Admitting: Podiatry

## 2020-04-26 ENCOUNTER — Ambulatory Visit (INDEPENDENT_AMBULATORY_CARE_PROVIDER_SITE_OTHER): Payer: 59 | Admitting: Podiatry

## 2020-04-26 ENCOUNTER — Other Ambulatory Visit: Payer: Self-pay

## 2020-04-26 ENCOUNTER — Ambulatory Visit (INDEPENDENT_AMBULATORY_CARE_PROVIDER_SITE_OTHER): Payer: 59

## 2020-04-26 DIAGNOSIS — M778 Other enthesopathies, not elsewhere classified: Secondary | ICD-10-CM

## 2020-04-26 DIAGNOSIS — M7989 Other specified soft tissue disorders: Secondary | ICD-10-CM

## 2020-04-26 DIAGNOSIS — R600 Localized edema: Secondary | ICD-10-CM

## 2020-04-26 NOTE — Progress Notes (Signed)
Subjective:   Patient ID: Nicole Griffin, female   DOB: 24 y.o.   MRN: [id number]   HPI Patient presents stating she has had swelling in the outside of the left foot and she had a cyst removed in 2016 has had swelling since 2014.  States that it is painful and more in the outside of the foot and not sure if it is related to the previous ganglionic.  Patient does not smoke likes to be active   Review of Systems  All other systems reviewed and are negative.       Objective:  Physical Exam Vitals and nursing note reviewed.  Constitutional:      Appearance: She is well-developed.  Pulmonary:     Effort: Pulmonary effort is normal.  Musculoskeletal:        General: Normal range of motion.  Skin:    General: Skin is warm.  Neurological:     Mental Status: She is alert.     Neurovascular status found to be intact muscle strength found to be adequate range of motion within normal limits.  Patient had negative Bevelyn Buckles' sign noted left does have +1 pitting edema in the outside of the left foot and into the extensor peroneal tendon group.  Small incision on the dorsum of the left foot but that does not appear to be contributory towards the problem and the swelling is moderately diffuse in this area.  Good digital perfusion noted     Assessment:  Probability for lymphatic edema left which is most likely hereditary or related to systemic condition versus local with tendinitis which is occurred     Plan:  H&P x-ray reviewed and today I did a sterile prep and injected the tendon complex left 3 mg Kenalog 5 mg Xylocaine and I applied compression stocking to try to take some of the swelling out of the foot.  Patient will be seen back for [place] to recheck again and I did explain to the mother and to daughter that this is probably hereditary and it can be difficult to completely get rid of the swelling that she has that most likely she will deal with this.  X-rays were negative for signs of  arthritis or stress fracture associated with this condition

## 2020-04-29 ENCOUNTER — Other Ambulatory Visit: Payer: Self-pay | Admitting: Podiatry

## 2020-04-29 DIAGNOSIS — M778 Other enthesopathies, not elsewhere classified: Secondary | ICD-10-CM

## 2020-07-29 ENCOUNTER — Telehealth: Payer: Self-pay

## 2020-07-29 NOTE — Telephone Encounter (Signed)
Patient called to schedule [facility] appointment as it is time to follow-up Right breast ultrasound (right breast mass), but now has [employer]. Patient informed that now that she has insurance coverage can call the [place] directly to schedule.

## 2020-07-30 ENCOUNTER — Other Ambulatory Visit: Payer: Self-pay | Admitting: Obstetrics and Gynecology

## 2020-07-30 DIAGNOSIS — N63 Unspecified lump in unspecified breast: Secondary | ICD-10-CM

## 2020-08-30 ENCOUNTER — Ambulatory Visit
Admission: RE | Admit: 2020-08-30 | Discharge: 2020-08-30 | Disposition: A | Discharge status: Home / Self Care | Payer: Medicaid Other | Source: Ambulatory Visit | Attending: Obstetrics and Gynecology | Admitting: Obstetrics and Gynecology

## 2020-08-30 ENCOUNTER — Other Ambulatory Visit: Payer: Self-pay | Admitting: Obstetrics and Gynecology

## 2020-08-30 ENCOUNTER — Other Ambulatory Visit: Payer: Self-pay

## 2020-08-30 DIAGNOSIS — N63 Unspecified lump in unspecified breast: Secondary | ICD-10-CM

## 2020-09-13 ENCOUNTER — Ambulatory Visit
Admission: RE | Admit: 2020-09-13 | Discharge: 2020-09-13 | Disposition: A | Discharge status: Home / Self Care | Payer: 59 | Source: Ambulatory Visit | Attending: Obstetrics and Gynecology | Admitting: Obstetrics and Gynecology

## 2020-09-13 ENCOUNTER — Other Ambulatory Visit: Payer: Self-pay | Admitting: Obstetrics and Gynecology

## 2020-09-13 ENCOUNTER — Other Ambulatory Visit: Payer: Self-pay

## 2020-09-13 DIAGNOSIS — N63 Unspecified lump in unspecified breast: Secondary | ICD-10-CM

## 2021-05-19 ENCOUNTER — Encounter (HOSPITAL_COMMUNITY): Payer: Self-pay

## 2021-05-19 ENCOUNTER — Other Ambulatory Visit: Payer: Self-pay

## 2021-05-19 ENCOUNTER — Ambulatory Visit (HOSPITAL_COMMUNITY)
Admission: EM | Admit: 2021-05-19 | Discharge: 2021-05-19 | Disposition: A | Discharge status: Home / Self Care | Payer: 59 | Attending: Sports Medicine | Admitting: Sports Medicine

## 2021-05-19 DIAGNOSIS — M778 Other enthesopathies, not elsewhere classified: Secondary | ICD-10-CM | POA: Diagnosis not present

## 2021-05-19 DIAGNOSIS — M25531 Pain in right wrist: Secondary | ICD-10-CM

## 2021-05-19 MED ORDER — MELOXICAM 15 MG PO TABS: 15.0000 mg | ORAL_TABLET | Freq: Every day | ORAL | 0 refills | Status: AC

## 2021-05-19 NOTE — ED Triage Notes (Signed)
Pt presents with right wrist pain with no known injury X 2 days.

## 2021-05-19 NOTE — ED Provider Notes (Signed)
[facility]    CSN: [id number] Arrival date & time: 05/19/21  1300      History   Chief Complaint Chief Complaint  Patient presents with   Wrist Pain    HPI Nicole Griffin is a 25 y.o. female who presents for ulnar-sided right wrist pain x 3 days.   Nicole Griffin is a pleasant 25 year old female who presents with right wrist pain since Friday.  She denies any inciting event or trauma.  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  She noticed Friday throughout the day that the ulnar side of her right wrist started becoming painful.  She has a dull aching pain at rest, however but will have a sharp pain when she supinates her hand or radially deviates it.  She denies any prior injury to the hand/wrist.  She has not taken any medications, ice, or heat to the wrist at this point.  She denies any redness, swelling, ecchymosis.  She denies any fever/chills herself or other signs of systemic illness.  Pain is worse with activity, specifically supinating her radially/ulnar deviating the wrist.  She denies any loss of grip strength.  No pain in the elbow, metacarpals or digits.  Past Medical History:  Diagnosis Date   Fibrocystic breast    Ganglion cyst     There are no problems to display for this patient.   Past Surgical History:  Procedure Laterality Date   GANGLION CYST EXCISION Left 10/19/2014   Procedure: LEFT FOOT GANGLION EXCISION;  Surgeon: Renette Butters, MD;  Location: [place];  Service: Orthopedics;  Laterality: Left;   WISDOM TOOTH EXTRACTION      OB History     Gravida  0   Para  0   Term  0   Preterm  0   AB  0   Living  0      SAB  0   IAB  0   Ectopic  0   Multiple  0   Live Births  0            Home Medications    Prior to Admission medications   Medication Sig Start Date End Date Taking? Authorizing Provider  drospirenone-ethinyl  estradiol (NIKKI) 3-0.02 MG tablet Take 1 tablet by mouth daily. Patient not taking: Reported on 04/26/2020    Joline Salt, RN  ibuprofen (ADVIL) 600 MG tablet Take 1 tablet (600 mg total) by mouth every 6 (six) hours as needed. Patient not taking: Reported on 04/26/2020 04/29/19   Sharion Balloon, NP  meloxicam (MOBIC) 15 MG tablet Take 1 tablet (15 mg total) by mouth daily for 21 days. 05/19/21 06/09/21 Yes Elba Barman, DO  Multiple Vitamins-Minerals (EQ MULTIVITAMINS ADULT GUMMY) CHEW Chew by mouth. Patient not taking: Reported on 04/26/2020    Joline Salt, RN    Family History Family History  Problem Relation Age of Onset   Hypertension Maternal Grandmother    Healthy Mother     Social History Social History   Tobacco Use   Smoking status: Never   Smokeless tobacco: Never  Vaping Use   Vaping Use: Former  Substance Use Topics   Alcohol use: Yes    Comment: rare   Drug use: No     Allergies   Patient has no known allergies.   Review of Systems Review of Systems - denies fever/chills - no fatigue +  reports right wrist pain Denies redness, swelling, ecchymosis   Physical Exam Triage Vital Signs ED Triage Vitals  Enc Vitals Group     BP 05/19/21 1405 106/74     Pulse Rate 05/19/21 1405 75     Resp 05/19/21 1405 18     Temp 05/19/21 1405 98.3 F (36.8 C)     Temp Source 05/19/21 1405 Oral     SpO2 05/19/21 1405 98 %     Weight --      Height --      Head Circumference --      Peak Flow --      Pain Score 05/19/21 1407 6     Pain Loc --      Pain Edu? --      Excl. in [place]? --    No data found.  Updated Vital Signs BP 106/74 (BP Location: Right Arm)   Pulse 75   Temp 98.3 F (36.8 C) (Oral)   Resp 18   LMP 05/07/2021   SpO2 98%    Physical Exam Gen: Well-appearing, in no acute distress; non-toxic CV: Regular Rate. Well-perfused. Warm.  Resp: Breathing unlabored on room air; no wheezing. Psych: Fluid speech in conversation; appropriate affect;  normal thought process Neuro: Sensation intact throughout. No gross coordination deficits.  MSK:  - Right wrist: There is mild tenderness to palpation just lateral to the ulnar styloid.  There is no erythema, ecchymosis, or bony deformity.  Range of motion is full in all directions, albeit with pain with radial deviation and supination.  Pain with resisted ulnar deviation. palpation is normal over the metacarpals, scaphoid, lunate, TFCC.  Negative fovea sign.  Normal grip strength. + ECU Synergy test.  Neurovascularly intact.  Sensation light touch intact throughout dorsal and plantar aspect of the extremity.     UC Treatments / Results  Labs (all labs ordered are listed, but only abnormal results are displayed) Labs Reviewed - No data to display  Imaging: Limited U/S -Bedside ultrasound demonstrates ECU tendon identified in both long and short axis.  There is a target sign with hypoechoic fluid surrounding the ECU tendon at the level of the dorsal wrist.  Pisa form identified distal to this location without any bony or cortical irregularity.  Procedures Procedures (including critical care time) - wrist brace applied  Medications Ordered in UC Medications - No data to display  Initial Impression / Assessment and Plan / UC Course  I have reviewed the triage vital signs and the nursing notes.  Pertinent labs & imaging results that were available during my care of the patient were reviewed by me and considered in my medical decision making (see chart for details).     Right ulnar wrist pain - likely 2/2 to ECU tendonitis.  Patient does a lot of repetitive motions with the wrist at work, which exacerbated the pain.  No trauma or inciting event.  Bedside ultrasound performed by myself did show target sign with hypoechoic fluid surrounding the ECU tendon.  She has no tenderness over the ulna, TFCC, or metacarpals, so I do not believe an x-ray is necessary. She will be placed in a wrist brace,  Mobic once daily, and a work note was provided. She is also to ice the area 3x daily. I would expect this to get better with above and activity modification, return precautions provided.  Final Clinical Impressions(s) / UC Diagnoses   Final diagnoses:  Right wrist pain  Extensor carpi ulnaris tendinitis  Discharge Instructions   None    ED Prescriptions     Medication Sig Dispense Auth. Provider   meloxicam (MOBIC) 15 MG tablet Take 1 tablet (15 mg total) by mouth daily for 21 days. 21 tablet Elba Barman, DO      PDMP not reviewed this encounter.   Elba Barman, DO 05/19/21 1456

## 2021-07-31 IMAGING — US US BREAST*R* LIMITED INC AXILLA
1 series · 13 of 16 positions shown · non-contrast
Comparison: Previous exam(s).

CLINICAL DATA: Follow-up for known RIGHT breast mass.

EXAM:
ULTRASOUND OF THE RIGHT BREAST

[Series 1: us breast*right* limited inc axilla · 0.07mm/px · 13 of 16 slices shown]
[im 1/16]
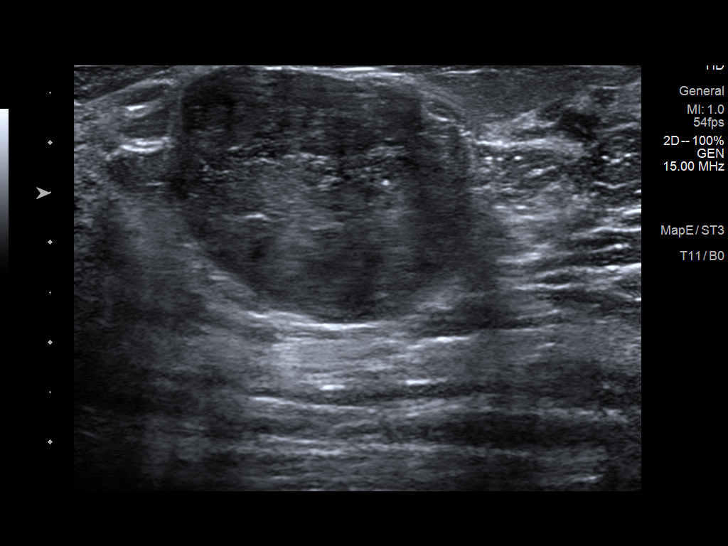
[im 2/16]
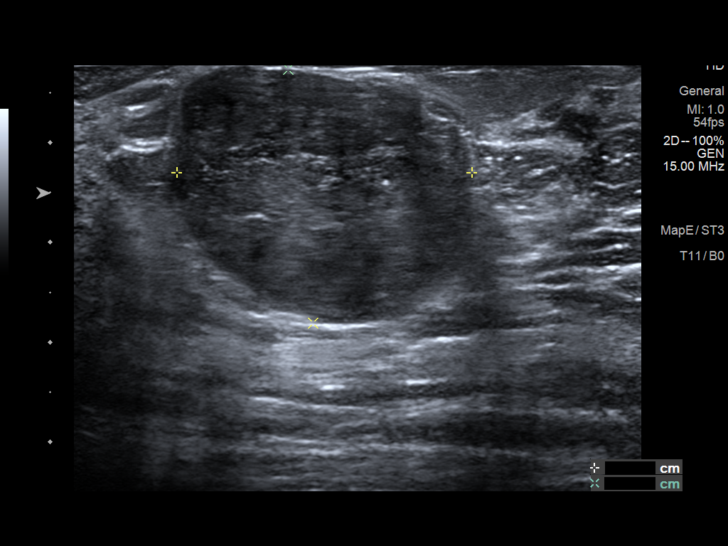
[im 4/16]
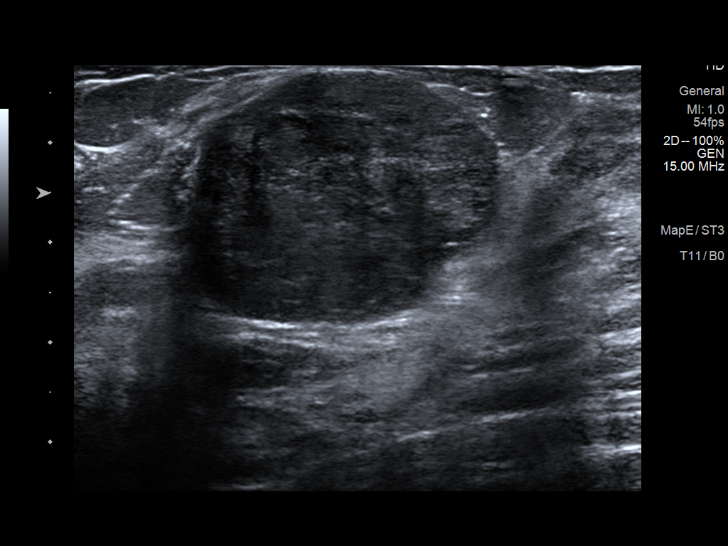
[im 5/16]
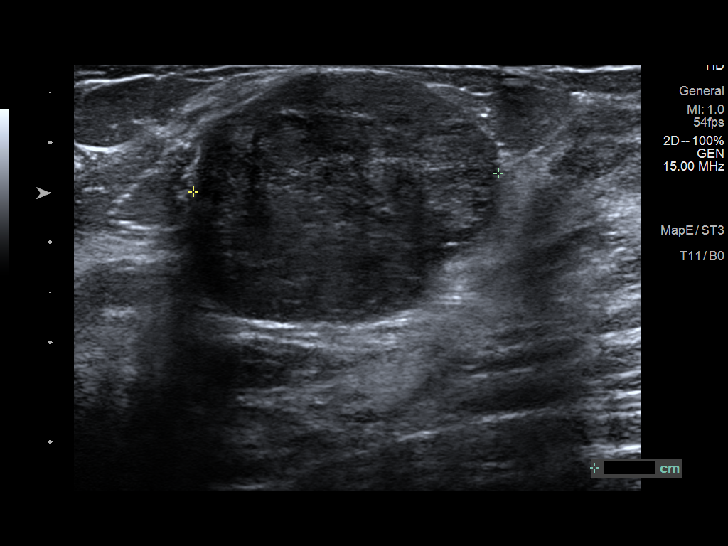
[im 6/16]
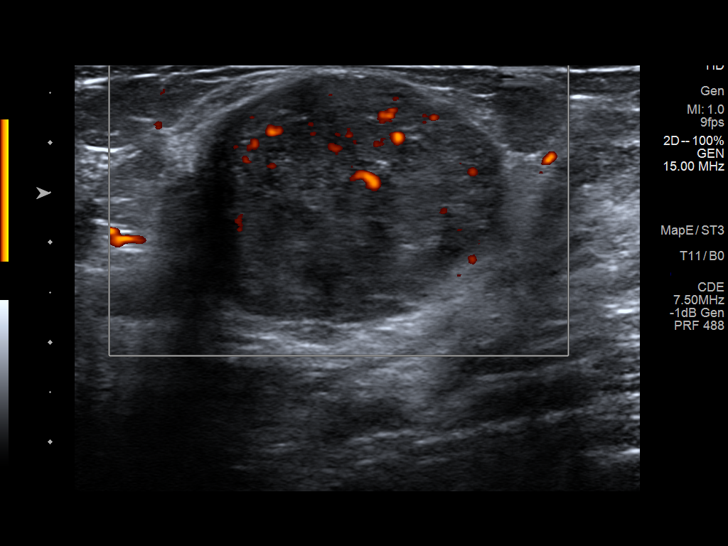
[im 7/16]
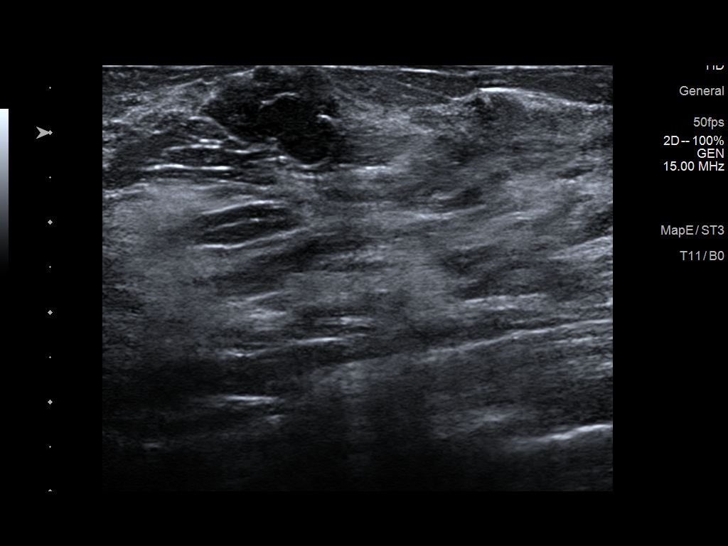
[im 9/16]
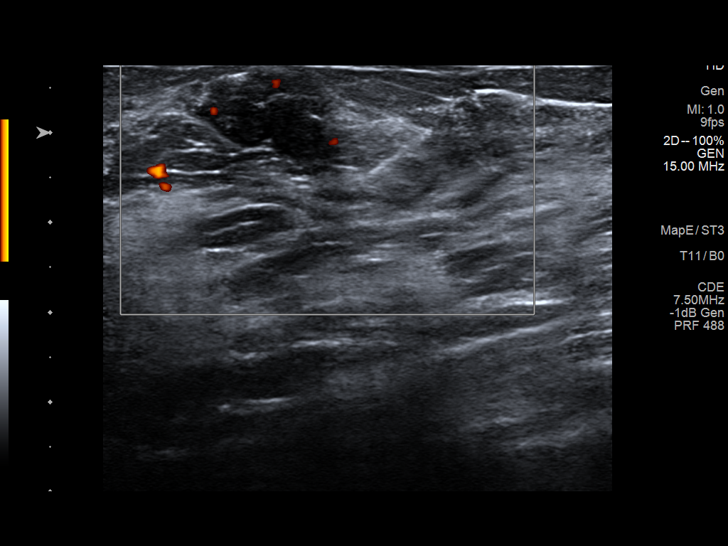
[im 10/16]
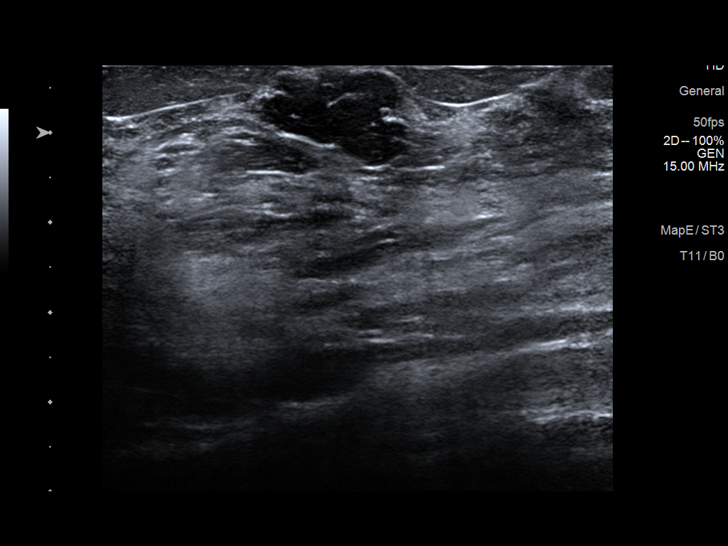
[im 11/16]
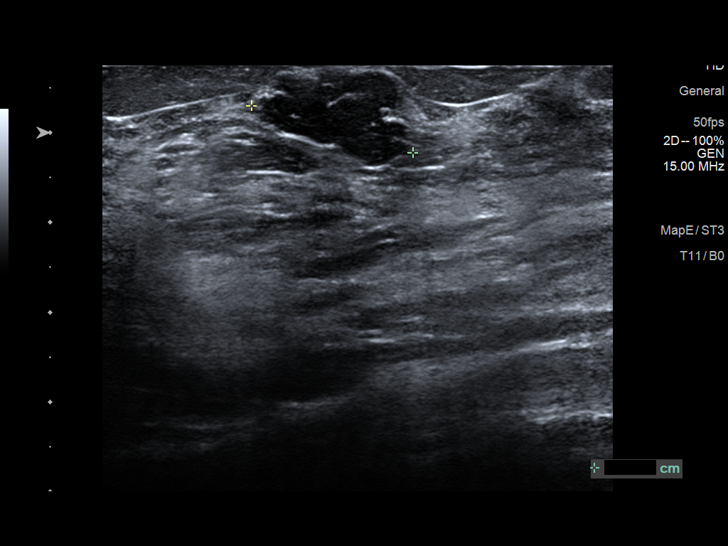
[im 12/16]
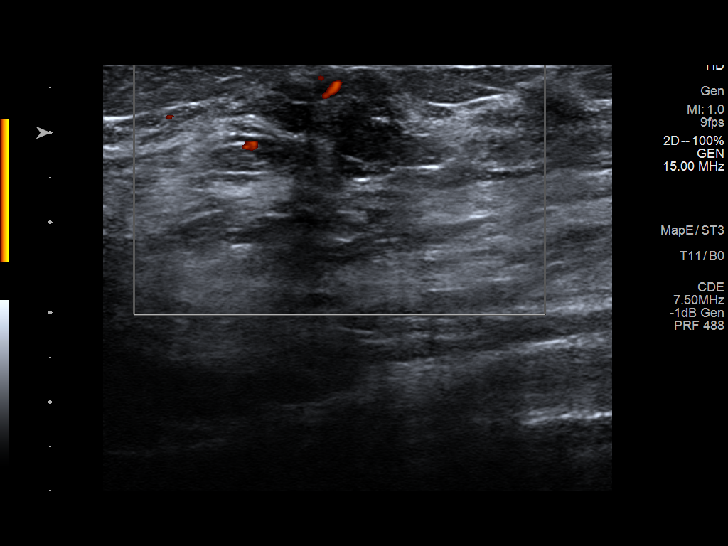
[im 13/16]
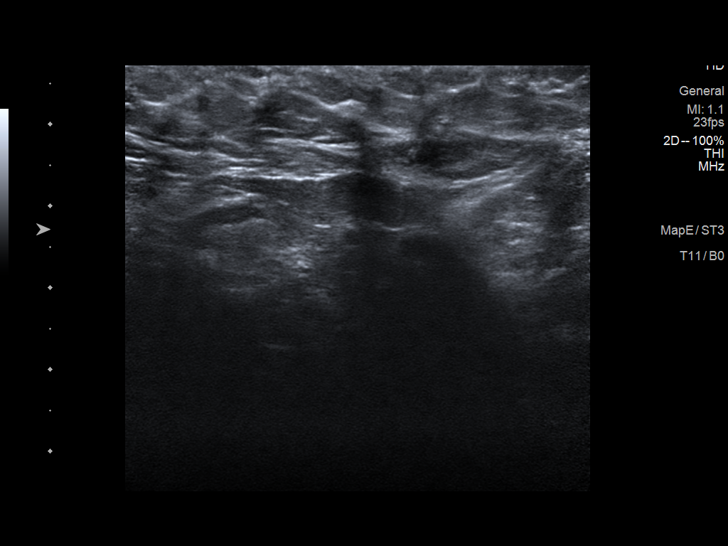
[im 15/16]
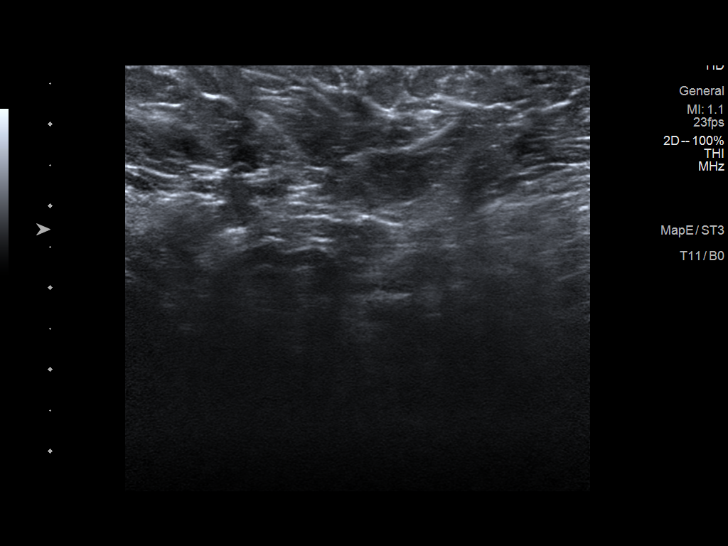
[im 16/16]
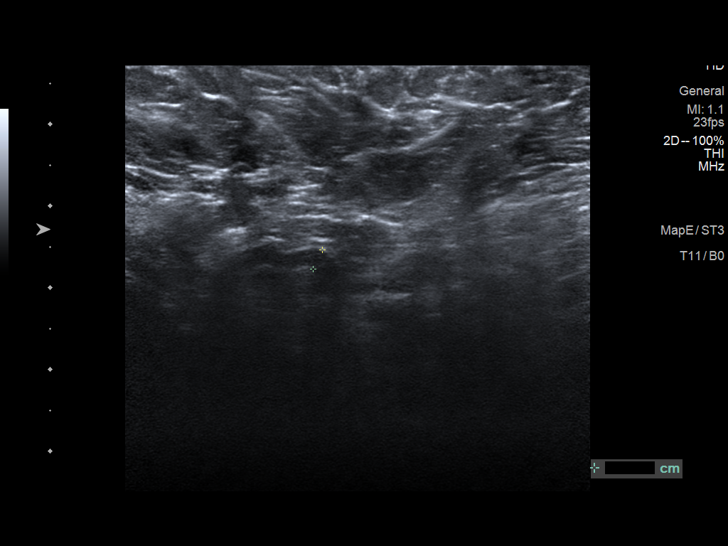

[13 of 16 positions shown; findings below may reference images not displayed]

FINDINGS: Targeted ultrasound is performed, again showing an oval
circumscribed hypoechoic mass in the RIGHT breast at the 9:30
o'clock axis, 5 cm from the nipple, measuring 3.1 x 2.6 x 3 cm, not
significantly changed for greater than 2 years confirming benignity,
presumed fibroadenoma.

There is a new irregular hypoechoic mass in the RIGHT breast at the
9:30 o'clock axis, 3 cm from the nipple, measuring 1.9 x 1.1 x
cm, with internal vascularity, corresponding as incidental finding.
IMPRESSION: 1. Irregular mass in the RIGHT breast at the 9:30 o'clock axis, 3 cm
from the nipple, measuring 1.9 x 1.1 x 1.6 cm. This may represent an
additional fibroadenoma. Recommend ultrasound-guided core biopsy to
ensure benignity.
2. Stable benign mass within the RIGHT breast at the 9:30 o'clock
axis, 5 cm from the nipple, measuring 3.1 cm, stable now for greater
than 2 years confirming benignity, presumed fibroadenoma. No
additional imaging follow-up is necessary for this benign finding.
Patient was encouraged to return for additional imaging if the
associated palpable findings were later felt to increase in size or
extent.

RECOMMENDATION:
Ultrasound-guided biopsy for the new mass in the RIGHT breast at the
9:30 o'clock axis, 3 cm from the nipple, measuring 1.9 cm.

Ultrasound-guided biopsy is scheduled for [REDACTED].

I have discussed the findings and recommendations with the patient
and her mother. If applicable, a reminder letter will be sent to the
patient regarding the next appointment.

BI-RADS CATEGORY  4: Suspicious.

## 2022-07-30 ENCOUNTER — Ambulatory Visit: Payer: Self-pay | Admitting: General Surgery

## 2022-07-30 DIAGNOSIS — D241 Benign neoplasm of right breast: Secondary | ICD-10-CM | POA: Diagnosis not present

## 2022-08-10 ENCOUNTER — Ambulatory Visit: Payer: 59 | Admitting: Podiatry

## 2022-08-17 ENCOUNTER — Other Ambulatory Visit: Payer: Self-pay | Admitting: General Surgery

## 2022-08-17 DIAGNOSIS — D241 Benign neoplasm of right breast: Secondary | ICD-10-CM

## 2022-08-18 ENCOUNTER — Other Ambulatory Visit: Payer: Self-pay | Admitting: General Surgery

## 2022-08-18 ENCOUNTER — Encounter: Payer: Self-pay | Admitting: General Surgery

## 2022-08-18 DIAGNOSIS — D241 Benign neoplasm of right breast: Secondary | ICD-10-CM

## 2022-10-02 ENCOUNTER — Encounter (HOSPITAL_BASED_OUTPATIENT_CLINIC_OR_DEPARTMENT_OTHER): Payer: Self-pay | Admitting: General Surgery

## 2022-10-02 ENCOUNTER — Other Ambulatory Visit: Payer: Self-pay

## 2022-10-05 NOTE — Progress Notes (Signed)

## 2022-10-08 ENCOUNTER — Ambulatory Visit
Admission: RE | Admit: 2022-10-08 | Discharge: 2022-10-08 | Disposition: A | Discharge status: Home / Self Care | Payer: 59 | Source: Ambulatory Visit | Attending: General Surgery | Admitting: General Surgery

## 2022-10-08 ENCOUNTER — Other Ambulatory Visit: Payer: Self-pay | Admitting: General Surgery

## 2022-10-08 DIAGNOSIS — D241 Benign neoplasm of right breast: Secondary | ICD-10-CM

## 2022-10-08 DIAGNOSIS — N6311 Unspecified lump in the right breast, upper outer quadrant: Secondary | ICD-10-CM | POA: Diagnosis not present

## 2022-10-08 HISTORY — PX: BREAST BIOPSY: SHX20

## 2022-10-08 NOTE — Anesthesia Preprocedure Evaluation (Addendum)
Anesthesia Evaluation  Patient identified by MRN, date of birth, ID band Patient awake    Reviewed: Allergy & Precautions, NPO status , Patient's Chart, lab work & pertinent test results  History of Anesthesia Complications Negative for: history of anesthetic complications  Airway Mallampati: II  TM Distance: >3 FB Neck ROM: Full    Dental no notable dental hx. (+) Dental Advisory Given   Pulmonary neg pulmonary ROS   Pulmonary exam normal        Cardiovascular negative cardio ROS Normal cardiovascular exam     Neuro/Psych negative neurological ROS     GI/Hepatic negative GI ROS, Neg liver ROS,,,  Endo/Other  negative endocrine ROS    Renal/GU negative Renal ROS     Musculoskeletal negative musculoskeletal ROS (+)    Abdominal   Peds  Hematology negative hematology ROS (+)   Anesthesia Other Findings   Reproductive/Obstetrics                             Anesthesia Physical Anesthesia Plan  ASA: 2  Anesthesia Plan: General   Post-op Pain Management: Tylenol PO (pre-op)* and Celebrex PO (pre-op)*   Induction: Intravenous  PONV Risk Score and Plan: 4 or greater and Ondansetron, Dexamethasone, Midazolam, Propofol infusion and TIVA  Airway Management Planned: LMA  Additional Equipment:   Intra-op Plan:   Post-operative Plan: Extubation in OR  Informed Consent: I have reviewed the patients History and Physical, chart, labs and discussed the procedure including the risks, benefits and alternatives for the proposed anesthesia with the patient or authorized representative who has indicated his/her understanding and acceptance.     Dental advisory given  Plan Discussed with: Anesthesiologist and CRNA  Anesthesia Plan Comments: (Pt advised of risk but refuses removal of nose piercing.)       Anesthesia Quick Evaluation

## 2022-10-09 ENCOUNTER — Other Ambulatory Visit: Payer: Self-pay

## 2022-10-09 ENCOUNTER — Encounter (HOSPITAL_BASED_OUTPATIENT_CLINIC_OR_DEPARTMENT_OTHER)
Admission: RE | Disposition: A | Discharge status: Home / Self Care | Payer: Self-pay | Source: Ambulatory Visit | Attending: General Surgery

## 2022-10-09 ENCOUNTER — Ambulatory Visit (HOSPITAL_BASED_OUTPATIENT_CLINIC_OR_DEPARTMENT_OTHER)
Admission: RE | Admit: 2022-10-09 | Discharge: 2022-10-09 | Disposition: A | Discharge status: Home / Self Care | Payer: 59 | Source: Ambulatory Visit | Attending: General Surgery | Admitting: General Surgery

## 2022-10-09 ENCOUNTER — Ambulatory Visit (HOSPITAL_BASED_OUTPATIENT_CLINIC_OR_DEPARTMENT_OTHER): Payer: 59 | Admitting: Anesthesiology

## 2022-10-09 ENCOUNTER — Ambulatory Visit
Admission: RE | Admit: 2022-10-09 | Discharge: 2022-10-09 | Disposition: A | Discharge status: Home / Self Care | Payer: Self-pay | Source: Ambulatory Visit | Attending: General Surgery | Admitting: General Surgery

## 2022-10-09 ENCOUNTER — Encounter (HOSPITAL_BASED_OUTPATIENT_CLINIC_OR_DEPARTMENT_OTHER): Payer: Self-pay | Admitting: General Surgery

## 2022-10-09 DIAGNOSIS — D241 Benign neoplasm of right breast: Secondary | ICD-10-CM

## 2022-10-09 DIAGNOSIS — N6081 Other benign mammary dysplasias of right breast: Secondary | ICD-10-CM | POA: Insufficient documentation

## 2022-10-09 DIAGNOSIS — N6021 Fibroadenosis of right breast: Secondary | ICD-10-CM | POA: Diagnosis not present

## 2022-10-09 DIAGNOSIS — R928 Other abnormal and inconclusive findings on diagnostic imaging of breast: Secondary | ICD-10-CM | POA: Diagnosis not present

## 2022-10-09 DIAGNOSIS — N6011 Diffuse cystic mastopathy of right breast: Secondary | ICD-10-CM | POA: Diagnosis not present

## 2022-10-09 DIAGNOSIS — Z01818 Encounter for other preprocedural examination: Secondary | ICD-10-CM

## 2022-10-09 HISTORY — PX: BREAST LUMPECTOMY WITH RADIOACTIVE SEED LOCALIZATION: SHX6424

## 2022-10-09 HISTORY — DX: Prediabetes: R73.03

## 2022-10-09 HISTORY — PX: BREAST LUMPECTOMY: SHX2

## 2022-10-09 LAB — POCT PREGNANCY, URINE: Preg Test, Ur: NEGATIVE

## 2022-10-09 SURGERY — BREAST LUMPECTOMY
Anesthesia: General | Site: Breast | Laterality: Right

## 2022-10-09 MED ORDER — CHLORHEXIDINE GLUCONATE CLOTH 2 % EX PADS: 6.0000 | MEDICATED_PAD | Freq: Once | CUTANEOUS | Status: DC

## 2022-10-09 MED ORDER — PROPOFOL 500 MG/50ML IV EMUL
INTRAVENOUS | Status: DC | PRN
  Administered 2022-10-09: 200 ug/kg/min via INTRAVENOUS

## 2022-10-09 MED ORDER — CELECOXIB 200 MG PO CAPS
ORAL_CAPSULE | ORAL | Status: AC
  Filled 2022-10-09: qty 1

## 2022-10-09 MED ORDER — PROMETHAZINE HCL 25 MG/ML IJ SOLN: 6.2500 mg | INTRAMUSCULAR | Status: DC | PRN

## 2022-10-09 MED ORDER — ALBUTEROL SULFATE (2.5 MG/3ML) 0.083% IN NEBU
2.5000 mg | INHALATION_SOLUTION | Freq: Four times a day (QID) | RESPIRATORY_TRACT | Status: DC | PRN
  Administered 2022-10-09: 2.5 mg via RESPIRATORY_TRACT

## 2022-10-09 MED ORDER — FENTANYL CITRATE (PF) 100 MCG/2ML IJ SOLN: 25.0000 ug | INTRAMUSCULAR | Status: DC | PRN

## 2022-10-09 MED ORDER — DEXAMETHASONE SODIUM PHOSPHATE 4 MG/ML IJ SOLN
INTRAMUSCULAR | Status: DC | PRN
  Administered 2022-10-09: 10 mg via INTRAVENOUS

## 2022-10-09 MED ORDER — ALBUTEROL SULFATE HFA 108 (90 BASE) MCG/ACT IN AERS
INHALATION_SPRAY | RESPIRATORY_TRACT | Status: DC | PRN
  Administered 2022-10-09: 2 via RESPIRATORY_TRACT

## 2022-10-09 MED ORDER — MIDAZOLAM HCL 2 MG/2ML IJ SOLN
INTRAMUSCULAR | Status: AC
  Filled 2022-10-09: qty 2

## 2022-10-09 MED ORDER — ACETAMINOPHEN 500 MG PO TABS
ORAL_TABLET | ORAL | Status: AC
  Filled 2022-10-09: qty 2

## 2022-10-09 MED ORDER — CEFAZOLIN SODIUM-DEXTROSE 2-4 GM/100ML-% IV SOLN
2.0000 g | INTRAVENOUS | Status: AC
  Administered 2022-10-09: 2 g via INTRAVENOUS

## 2022-10-09 MED ORDER — OXYCODONE HCL 5 MG PO TABS: 5.0000 mg | ORAL_TABLET | Freq: Four times a day (QID) | ORAL | 0 refills | Status: AC | PRN

## 2022-10-09 MED ORDER — ACETAMINOPHEN 500 MG PO TABS
1000.0000 mg | ORAL_TABLET | ORAL | Status: AC
  Administered 2022-10-09: 1000 mg via ORAL

## 2022-10-09 MED ORDER — GABAPENTIN 300 MG PO CAPS
ORAL_CAPSULE | ORAL | Status: AC
  Filled 2022-10-09: qty 1

## 2022-10-09 MED ORDER — CEFAZOLIN SODIUM-DEXTROSE 2-4 GM/100ML-% IV SOLN
INTRAVENOUS | Status: AC
  Filled 2022-10-09: qty 100

## 2022-10-09 MED ORDER — LACTATED RINGERS IV SOLN: INTRAVENOUS | Status: DC

## 2022-10-09 MED ORDER — ALBUTEROL SULFATE HFA 108 (90 BASE) MCG/ACT IN AERS
INHALATION_SPRAY | RESPIRATORY_TRACT | Status: AC
  Filled 2022-10-09: qty 6.7

## 2022-10-09 MED ORDER — PROPOFOL 500 MG/50ML IV EMUL
INTRAVENOUS | Status: AC
  Filled 2022-10-09: qty 50

## 2022-10-09 MED ORDER — CELECOXIB 200 MG PO CAPS
200.0000 mg | ORAL_CAPSULE | ORAL | Status: AC
  Administered 2022-10-09: 200 mg via ORAL

## 2022-10-09 MED ORDER — GABAPENTIN 300 MG PO CAPS
300.0000 mg | ORAL_CAPSULE | ORAL | Status: AC
  Administered 2022-10-09: 300 mg via ORAL

## 2022-10-09 MED ORDER — ONDANSETRON HCL 4 MG/2ML IJ SOLN
INTRAMUSCULAR | Status: AC
  Filled 2022-10-09: qty 2

## 2022-10-09 MED ORDER — PROPOFOL 10 MG/ML IV BOLUS
INTRAVENOUS | Status: DC | PRN
  Administered 2022-10-09: 250 mg via INTRAVENOUS

## 2022-10-09 MED ORDER — FENTANYL CITRATE (PF) 100 MCG/2ML IJ SOLN
INTRAMUSCULAR | Status: AC
  Filled 2022-10-09: qty 2

## 2022-10-09 MED ORDER — ONDANSETRON HCL 4 MG/2ML IJ SOLN
INTRAMUSCULAR | Status: DC | PRN
  Administered 2022-10-09: 4 mg via INTRAVENOUS

## 2022-10-09 MED ORDER — DEXAMETHASONE SODIUM PHOSPHATE 10 MG/ML IJ SOLN
INTRAMUSCULAR | Status: AC
  Filled 2022-10-09: qty 2

## 2022-10-09 MED ORDER — AMISULPRIDE (ANTIEMETIC) 5 MG/2ML IV SOLN: 10.0000 mg | Freq: Once | INTRAVENOUS | Status: DC | PRN

## 2022-10-09 MED ORDER — BUPIVACAINE-EPINEPHRINE (PF) 0.5% -1:200000 IJ SOLN
INTRAMUSCULAR | Status: AC
  Filled 2022-10-09: qty 60

## 2022-10-09 MED ORDER — BUPIVACAINE-EPINEPHRINE (PF) 0.5% -1:200000 IJ SOLN
INTRAMUSCULAR | Status: DC | PRN
  Administered 2022-10-09: 20 mL

## 2022-10-09 MED ORDER — LIDOCAINE 2% (20 MG/ML) 5 ML SYRINGE
INTRAMUSCULAR | Status: AC
  Filled 2022-10-09: qty 5

## 2022-10-09 MED ORDER — FENTANYL CITRATE (PF) 100 MCG/2ML IJ SOLN
INTRAMUSCULAR | Status: DC | PRN
  Administered 2022-10-09: 100 ug via INTRAVENOUS

## 2022-10-09 MED ORDER — ALBUTEROL SULFATE (2.5 MG/3ML) 0.083% IN NEBU
INHALATION_SOLUTION | RESPIRATORY_TRACT | Status: AC
  Filled 2022-10-09: qty 3

## 2022-10-09 MED ORDER — LIDOCAINE HCL (CARDIAC) PF 100 MG/5ML IV SOSY
PREFILLED_SYRINGE | INTRAVENOUS | Status: DC | PRN
  Administered 2022-10-09: 100 mg via INTRAVENOUS
  Administered 2022-10-09: 50 mg via INTRAVENOUS

## 2022-10-09 MED ORDER — MIDAZOLAM HCL 5 MG/5ML IJ SOLN
INTRAMUSCULAR | Status: DC | PRN
  Administered 2022-10-09: 2 mg via INTRAVENOUS

## 2022-10-09 SURGICAL SUPPLY — 37 items
APPLIER CLIP 9.375 MED OPEN (MISCELLANEOUS)
BLADE SURG 15 STRL LF DISP TIS (BLADE) ×1 IMPLANT
BLADE SURG 15 STRL SS (BLADE) ×1
CANISTER SUC SOCK COL 7IN (MISCELLANEOUS) ×1 IMPLANT
CANISTER SUCT 1200ML W/VALVE (MISCELLANEOUS) ×1 IMPLANT
CHLORAPREP W/TINT 26 (MISCELLANEOUS) ×1 IMPLANT
CLIP APPLIE 9.375 MED OPEN (MISCELLANEOUS) IMPLANT
COVER BACK TABLE 60X90IN (DRAPES) ×1 IMPLANT
COVER MAYO STAND STRL (DRAPES) ×1 IMPLANT
COVER PROBE CYLINDRICAL 5X96 (MISCELLANEOUS) ×1 IMPLANT
DERMABOND ADVANCED .7 DNX12 (GAUZE/BANDAGES/DRESSINGS) ×1 IMPLANT
DRAPE LAPAROSCOPIC ABDOMINAL (DRAPES) ×1 IMPLANT
DRAPE UTILITY XL STRL (DRAPES) ×1 IMPLANT
ELECT COATED BLADE 2.86 ST (ELECTRODE) ×1 IMPLANT
ELECT REM PT RETURN 9FT ADLT (ELECTROSURGICAL) ×1
ELECTRODE REM PT RTRN 9FT ADLT (ELECTROSURGICAL) ×1 IMPLANT
GLOVE BIO SURGEON STRL SZ7.5 (GLOVE) ×2 IMPLANT
GLOVE BIO SURGEON STRL SZ8 (GLOVE) IMPLANT
GOWN STRL REUS W/ TWL LRG LVL3 (GOWN DISPOSABLE) ×2 IMPLANT
GOWN STRL REUS W/TWL LRG LVL3 (GOWN DISPOSABLE) ×2
KIT MARKER MARGIN INK (KITS) ×1 IMPLANT
NDL HYPO 25X1 1.5 SAFETY (NEEDLE) ×1 IMPLANT
NEEDLE HYPO 25X1 1.5 SAFETY (NEEDLE) ×1 IMPLANT
NS IRRIG 1000ML POUR BTL (IV SOLUTION) ×1 IMPLANT
PACK BASIN DAY SURGERY FS (CUSTOM PROCEDURE TRAY) ×1 IMPLANT
PENCIL SMOKE EVACUATOR (MISCELLANEOUS) ×1 IMPLANT
SLEEVE SCD COMPRESS KNEE MED (STOCKING) ×1 IMPLANT
SPIKE FLUID TRANSFER (MISCELLANEOUS) ×1 IMPLANT
SPONGE T-LAP 18X18 ~~LOC~~+RFID (SPONGE) ×1 IMPLANT
SUT MON AB 4-0 PC3 18 (SUTURE) ×1 IMPLANT
SUT SILK 2 0 SH (SUTURE) ×1 IMPLANT
SUT VICRYL 3-0 CR8 SH (SUTURE) ×1 IMPLANT
SYR CONTROL 10ML LL (SYRINGE) ×1 IMPLANT
TOWEL GREEN STERILE FF (TOWEL DISPOSABLE) ×1 IMPLANT
TRAY FAXITRON CT DISP (TRAY / TRAY PROCEDURE) ×1 IMPLANT
TUBE CONNECTING 20X1/4 (TUBING) ×1 IMPLANT
YANKAUER SUCT BULB TIP NO VENT (SUCTIONS) ×1 IMPLANT

## 2022-10-09 NOTE — Discharge Instructions (Addendum)
May take Tylenol after 1:30pm, if needed.  May take NSAIDS (ibuprofen, motrin) after 1:30pm, if needed.     Post Anesthesia Home Care Instructions  Activity: Get plenty of rest for the remainder of the day. A responsible individual must stay with you for 24 hours following the procedure.  For the next 24 hours, DO NOT: -Drive a car -Paediatric nurse -Drink alcoholic beverages -Take any medication unless instructed by your physician -Make any legal decisions or sign important papers.  Meals: Start with liquid foods such as gelatin or soup. Progress to regular foods as tolerated. Avoid greasy, spicy, heavy foods. If nausea and/or vomiting occur, drink only clear liquids until the nausea and/or vomiting subsides. Call your physician if vomiting continues.  Special Instructions/Symptoms: Your throat may feel dry or sore from the anesthesia or the breathing tube placed in your throat during surgery. If this causes discomfort, gargle with warm salt water. The discomfort should disappear within 24 hours.  If you had a scopolamine patch placed behind your ear for the management of post- operative nausea and/or vomiting:  1. The medication in the patch is effective for 72 hours, after which it should be removed.  Wrap patch in a tissue and discard in the trash. Wash hands thoroughly with soap and water. 2. You may remove the patch earlier than 72 hours if you experience unpleasant side effects which may include dry mouth, dizziness or visual disturbances. 3. Avoid touching the patch. Wash your hands with soap and water after contact with the patch.

## 2022-10-09 NOTE — Anesthesia Postprocedure Evaluation (Signed)
Anesthesia Post Note  Patient: Nicole Griffin  Procedure(s) Performed: RIGHT BREAST LUMPECTOMY (Right: Breast) RIGHT BREAST RADIOACTIVE SEED LOCALIZED LUMPECTOMY (Right: Breast)     Patient location during evaluation: PACU Anesthesia Type: General Level of consciousness: sedated Pain management: pain level controlled Vital Signs Assessment: post-procedure vital signs reviewed and stable Respiratory status: spontaneous breathing and respiratory function stable Cardiovascular status: stable Postop Assessment: no apparent nausea or vomiting Anesthetic complications: yes Comments: I discussed patients episode of passive regurgitation of partially digested food with the mother and patient.  I explained that this may be due to issues like gastric motility problem, anxiety, or noncompliance with NPO guidelines. She states that patient was compliant and does not have any know gi issues.  We talked about the potential for aspiration and subsequent infection.  They were instructed to be vigilant for fever, worsening respiratory symptoms and to follow up with their primary care or ER in these occur.  On discharge, RA sat was 98% and the patient denied any respiratory symptoms.   Encounter Notable Events  Notable Event Outcome Phase Comment  Aspiration Resolved in Lab Intraprocedure Pt vomitted after removal of LMA while in OR.  Required mask ventilation.  Copiuos amounts of patially digested food: concerning for gastric motility issue or noncompliance with NPO guidlines.    Last Vitals:  Vitals:   10/09/22 1109 10/09/22 1113  BP:    Pulse: 86 86  Resp:    Temp:    SpO2: 97% 98%    Last Pain:  Vitals:   10/09/22 1100  TempSrc:   PainSc: 4                  Daysean Tinkham DANIEL

## 2022-10-09 NOTE — Transfer of Care (Signed)
Immediate Anesthesia Transfer of Care Note  Patient: Nicole Griffin  Procedure(s) Performed: RIGHT BREAST LUMPECTOMY (Right: Breast) RIGHT BREAST RADIOACTIVE SEED LOCALIZED LUMPECTOMY (Right: Breast)  Patient Location: PACU  Anesthesia Type:General  Level of Consciousness: awake  Airway & Oxygen Therapy: Patient Spontanous Breathing and Patient connected to face mask oxygen  Post-op Assessment: Report given to RN and Post -op Vital signs reviewed and stable  Post vital signs: Reviewed and stable  Last Vitals:  Vitals Value Taken Time  BP 129/72 10/09/22 0945  Temp    Pulse 89 10/09/22 0945  Resp    SpO2 94 % 10/09/22 0945  Vitals shown include unvalidated device data.  Last Pain:  Vitals:   10/09/22 0731  TempSrc: Oral  PainSc: 0-No pain      Patients Stated Pain Goal: 5 (99/83/38 2505)  Complications: No notable events documented.

## 2022-10-09 NOTE — H&P (Signed)
MRN: [id number] DOB: October 02, 1995 Subjective   Chief Complaint: Follow-up   History of Present Illness: Nicole Griffin is a 27 y.o. female who is seen today for fibroadenoma. The patient is a 27 year old black female who we saw last year with a palpable mass in the outer aspect of the right breast. On ultrasound she actually had 2 masses in the outer aspect. 1 measured about 3 cm and the other about 2 cm. The larger 1 was stable for many years consistent with a fibroadenoma. The smaller one was biopsied and also was consistent with a fibroadenoma. She does have some discomfort associated with the larger 1. She would like to have both of these removed. She is otherwise in good health and does not smoke.    Review of Systems: A complete review of systems was obtained from the patient. I have reviewed this information and discussed as appropriate with the patient. See HPI as well for other ROS.  Review of Systems  Constitutional: Negative.  HENT: Negative.  Eyes: Negative.  Respiratory: Negative.  Cardiovascular: Negative.  Gastrointestinal: Negative.  Genitourinary: Negative.  Musculoskeletal: Negative.  Skin: Negative.  Neurological: Negative.  Endo/Heme/Allergies: Negative.  Psychiatric/Behavioral: Negative.    Medical History: Past Medical History:  Diagnosis Date  Allergic state   Patient Active Problem List  Diagnosis  Palpitations  Chest pain  Fibroadenoma of right breast   Past Surgical History:  Procedure Laterality Date  left foot    No Known Allergies  No current outpatient medications on file prior to visit.   No current facility-administered medications on file prior to visit.   Family History  Problem Relation Age of Onset  High blood pressure (Hypertension) Maternal Grandmother  Hyperlipidemia (Elevated cholesterol) Neg Hx  Myocardial Infarction (Heart attack) Neg Hx  Congenital heart disease Neg Hx  Irregular Heart Beat (Arrhythmia) Neg Hx   Stroke Neg Hx  Sudden death (unexpected death due to unknown cause) Neg Hx  High blood pressure (Hypertension) Cousin    Social History   Tobacco Use  Smoking Status Never  Smokeless Tobacco Never    Social History   Socioeconomic History  Marital status: Single  Tobacco Use  Smoking status: Never  Smokeless tobacco: Never   Objective:   There were no vitals filed for this visit.  There is no height or weight on file to calculate BMI.  Physical Exam Vitals reviewed.  Constitutional:  General: She is not in acute distress. Appearance: Normal appearance.  HENT:  Head: Normocephalic and atraumatic.  Right Ear: External ear normal.  Left Ear: External ear normal.  Nose: Nose normal.  Mouth/Throat:  Mouth: Mucous membranes are moist.  Pharynx: Oropharynx is clear.  Eyes:  General: No scleral icterus. Extraocular Movements: Extraocular movements intact.  Conjunctiva/sclera: Conjunctivae normal.  Pupils: Pupils are equal, round, and reactive to light.  Cardiovascular:  Rate and Rhythm: Normal rate and regular rhythm.  Pulses: Normal pulses.  Heart sounds: Normal heart sounds.  Pulmonary:  Effort: Pulmonary effort is normal. No respiratory distress.  Breath sounds: Normal breath sounds.  Abdominal:  General: Bowel sounds are normal.  Palpations: Abdomen is soft.  Tenderness: There is no abdominal tenderness.  Musculoskeletal:  General: No swelling, tenderness or deformity. Normal range of motion.  Cervical back: Normal range of motion and neck supple.  Skin: General: Skin is warm and dry.  Coloration: Skin is not jaundiced.  Neurological:  General: No focal deficit present.  Mental Status: She is alert and oriented to person, place,  and time.  Psychiatric:  Mood and Affect: Mood normal.  Behavior: Behavior normal.     Breast: There is a 3 cm round mobile palpable mass in the outer aspect of the right breast. There is no palpable mass in the left breast.  There is no palpable axillary, supraclavicular, or cervical lymphadenopathy.  Labs, Imaging and Diagnostic Testing:  Assessment and Plan:   Diagnoses and all orders for this visit:  Fibroadenoma of right breast    The patient appears to have 2 fibroadenomas in the outer aspect of the right breast. 1 is palpable and the other is not. She would like to have both of these removed and I feel like this is very reasonable. I have discussed with her in detail the risks and benefits of the operation to remove these as well as some of the technical aspects including the use of a radioactive seed for the nonpalpable 1 and she understands and wishes to proceed.

## 2022-10-09 NOTE — Interval H&P Note (Signed)
History and Physical Interval Note:  10/09/2022 8:13 AM  Nicole Griffin  has presented today for surgery, with the diagnosis of RIGHT BREAST  FIBROADENOMA x2.  The various methods of treatment have been discussed with the patient and family. After consideration of risks, benefits and other options for treatment, the patient has consented to  Procedure(s): RIGHT BREAST LUMPECTOMY (Right) RIGHT BREAST RADIOACTIVE SEED LOCALIZED LUMPECTOMY (Right) as a surgical intervention.  The patient's history has been reviewed, patient examined, no change in status, stable for surgery.  I have reviewed the patient's chart and labs.  Questions were answered to the patient's satisfaction.     Autumn Messing III

## 2022-10-09 NOTE — Anesthesia Procedure Notes (Signed)
Procedure Name: LMA Insertion Date/Time: 10/09/2022 8:44 AM  Performed by: Tawni Millers, CRNAPre-anesthesia Checklist: Patient identified, Emergency Drugs available, Suction available and Patient being monitored Patient Re-evaluated:Patient Re-evaluated prior to induction Oxygen Delivery Method: Circle system utilized Preoxygenation: Pre-oxygenation with 100% oxygen Induction Type: IV induction Ventilation: Mask ventilation without difficulty LMA: LMA inserted LMA Size: 4.0 Number of attempts: 1 Airway Equipment and Method: Bite block Placement Confirmation: positive ETCO2 Tube secured with: Tape Dental Injury: Teeth and Oropharynx as per pre-operative assessment

## 2022-10-09 NOTE — Op Note (Signed)
10/09/2022  9:22 AM  PATIENT:  Nicole Griffin  27 y.o. female  PRE-OPERATIVE DIAGNOSIS:  RIGHT BREAST  FIBROADENOMA x2  POST-OPERATIVE DIAGNOSIS:  RIGHT BREAST FIBROADENOMA x2  PROCEDURE:  Procedure(s): RIGHT BREAST LUMPECTOMY (Right) RIGHT BREAST RADIOACTIVE SEED LOCALIZED LUMPECTOMY (Right)  SURGEON:  Surgeon(s) and Role:    * Jovita Kussmaul, MD - Primary  PHYSICIAN ASSISTANT:   ASSISTANTS: none   ANESTHESIA:   local and general  EBL:  10 mL   BLOOD ADMINISTERED:none  DRAINS: none   LOCAL MEDICATIONS USED:  MARCAINE     SPECIMEN:  Source of Specimen:  fibroadenoma x 2  DISPOSITION OF SPECIMEN:  PATHOLOGY  COUNTS:  YES  TOURNIQUET:  * No tourniquets in log *  DICTATION: .Dragon Dictation  After informed consent was obtained the patient was brought to the operating room and placed in the supine position on the operating table.  After adequate induction of general anesthesia the patient's right breast was prepped with ChloraPrep, allowed to dry, and draped in usual sterile manner.  An appropriate timeout was performed.  Previously an I-125 seed was placed in the outer aspect of the right breast to mark an area of fibroadenoma.  The patient had a second fibroadenoma in the outer aspect of the right breast that was palpable.  The neoprobe was set to I-125 in the area of radioactivity was readily identified.  The area around this was infiltrated with quarter percent Marcaine.  A curvilinear incision was made along the outer edge of the areola with a 15 blade knife.  The incision was carried through the skin and subcutaneous tissue sharply with the electrocautery.  Dissection was then carried towards the radioactive seed under the direction of the neoprobe.  Once I more closely approach the radioactive seed I then removed a small circular portion of breast tissue sharply with the electrocautery around the radioactive seed while checking the area of radioactivity frequently.  Once  the specimen was removed it was oriented with the appropriate paint colors.  A specimen radiograph was obtained that showed the seed to be within the specimen.  The specimen was then sent to pathology for further evaluation.  Dissection was then carried towards the palpable mass.  Once this mass was encountered then I excised it from the rest of the fibrocystic tissue around it sharply with the electrocautery.  Once the mass was completely removed it was oriented with the appropriate paint colors.  The specimen was then sent to pathology for further evaluation.  Hemostasis was achieved using the Bovie electrocautery.  The wound was irrigated with saline and infiltrated with more quarter percent Marcaine.  The deep layer of the incision was then closed with layers of interrupted 3-0 Vicryl stitches.  The skin was then closed with interrupted 4-0 Monocryl subcuticular stitches.  Dermabond dressings were applied.  The patient tolerated the procedure well.  At the end of the case all needle sponge and instrument counts were correct.  The patient was then awakened and taken to recovery in stable condition.  PLAN OF CARE: Discharge to home after PACU  PATIENT DISPOSITION:  PACU - hemodynamically stable.   Delay start of Pharmacological VTE agent (>24hrs) due to surgical blood loss or risk of bleeding: not applicable

## 2022-10-12 ENCOUNTER — Encounter (HOSPITAL_BASED_OUTPATIENT_CLINIC_OR_DEPARTMENT_OTHER): Payer: Self-pay | Admitting: General Surgery

## 2022-10-15 ENCOUNTER — Encounter (HOSPITAL_COMMUNITY): Payer: Self-pay

## 2022-10-15 LAB — SURGICAL PATHOLOGY

## 2023-03-31 ENCOUNTER — Encounter: Payer: 59 | Admitting: Obstetrics & Gynecology

## 2023-04-26 ENCOUNTER — Encounter: Payer: BLUE CROSS/BLUE SHIELD | Admitting: Obstetrics and Gynecology

## 2024-10-17 ENCOUNTER — Emergency Department (HOSPITAL_BASED_OUTPATIENT_CLINIC_OR_DEPARTMENT_OTHER): Admission: EM | Admit: 2024-10-17 | Discharge: 2024-10-17 | Disposition: A | Discharge status: Home / Self Care

## 2024-10-17 ENCOUNTER — Emergency Department (HOSPITAL_BASED_OUTPATIENT_CLINIC_OR_DEPARTMENT_OTHER)

## 2024-10-17 ENCOUNTER — Encounter (HOSPITAL_BASED_OUTPATIENT_CLINIC_OR_DEPARTMENT_OTHER): Payer: Self-pay

## 2024-10-17 ENCOUNTER — Other Ambulatory Visit: Payer: Self-pay

## 2024-10-17 DIAGNOSIS — R079 Chest pain, unspecified: Secondary | ICD-10-CM | POA: Diagnosis not present

## 2024-10-17 DIAGNOSIS — S80211A Abrasion, right knee, initial encounter: Secondary | ICD-10-CM | POA: Diagnosis not present

## 2024-10-17 DIAGNOSIS — M7918 Myalgia, other site: Secondary | ICD-10-CM

## 2024-10-17 DIAGNOSIS — M25561 Pain in right knee: Secondary | ICD-10-CM | POA: Diagnosis present

## 2024-10-17 DIAGNOSIS — Y92410 Unspecified street and highway as the place of occurrence of the external cause: Secondary | ICD-10-CM | POA: Insufficient documentation

## 2024-10-17 DIAGNOSIS — M542 Cervicalgia: Secondary | ICD-10-CM | POA: Diagnosis not present

## 2024-10-17 MED ORDER — CYCLOBENZAPRINE HCL 5 MG PO TABS: 5.0000 mg | ORAL_TABLET | Freq: Three times a day (TID) | ORAL | 0 refills | Status: AC | PRN

## 2024-10-17 MED ORDER — ACETAMINOPHEN 500 MG PO TABS
1000.0000 mg | ORAL_TABLET | Freq: Once | ORAL | Status: AC
  Administered 2024-10-17: 1000 mg via ORAL
  Filled 2024-10-17: qty 2

## 2024-10-17 NOTE — ED Notes (Signed)
 Discharge instructions reviewed with patient. Patient verbalizes understanding, no further questions at this time. Medications/prescriptions and follow up information provided. No acute distress noted at time of departure.

## 2024-10-17 NOTE — ED Notes (Signed)
 Patient transported to X-ray

## 2024-10-17 NOTE — ED Notes (Signed)
 Patient transported to CT

## 2024-10-17 NOTE — ED Notes (Signed)
 ED Provider at bedside.
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
